# Patient Record
Sex: Male | Born: 1980 | Race: White | Hispanic: No | Marital: Married | Smoking: Current some day smoker
Health system: Southern US, Community
[De-identification: ages and names within clinical notes are randomized; demographics above are authoritative.]

## PROBLEM LIST (undated history)

## (undated) DIAGNOSIS — F419 Anxiety disorder, unspecified: Secondary | ICD-10-CM

## (undated) DIAGNOSIS — I10 Essential (primary) hypertension: Secondary | ICD-10-CM

## (undated) DIAGNOSIS — G479 Sleep disorder, unspecified: Secondary | ICD-10-CM

## (undated) DIAGNOSIS — R0683 Snoring: Secondary | ICD-10-CM

## (undated) DIAGNOSIS — E7800 Pure hypercholesterolemia, unspecified: Secondary | ICD-10-CM

## (undated) DIAGNOSIS — K760 Fatty (change of) liver, not elsewhere classified: Secondary | ICD-10-CM

## (undated) HISTORY — PX: OTHER SURGICAL HISTORY: SHX169

## (undated) HISTORY — PX: ANKLE FRACTURE SURGERY: SHX122

---

## 2017-01-31 NOTE — Telephone Encounter (Signed)
Pt called states he was in the ER and was referred to our office for his ER f/u. Pt requested for a call back to schedule.

## 2017-01-31 NOTE — Telephone Encounter (Signed)
Scheduled apt

## 2017-02-03 ENCOUNTER — Ambulatory Visit
Admit: 2017-02-03 | Discharge: 2017-02-03 | Payer: PRIVATE HEALTH INSURANCE | Attending: Family | Primary: Family Medicine

## 2017-02-03 DIAGNOSIS — I10 Essential (primary) hypertension: Secondary | ICD-10-CM

## 2017-02-03 MED ORDER — AMLODIPINE BESYLATE 10 MG PO TABS
10 MG | ORAL_TABLET | Freq: Every day | ORAL | 3 refills | Status: DC
Start: 2017-02-03 — End: 2017-10-19

## 2017-02-03 MED ORDER — OMEPRAZOLE 20 MG PO CPDR
20 MG | ORAL_CAPSULE | Freq: Every day | ORAL | 3 refills | Status: DC
Start: 2017-02-03 — End: 2017-08-08

## 2017-02-03 MED ORDER — ESCITALOPRAM OXALATE 10 MG PO TABS
10 | ORAL_TABLET | Freq: Every day | ORAL | 3 refills | Status: DC
Start: 2017-02-03 — End: 2017-08-08

## 2017-02-03 MED ORDER — VALSARTAN 160 MG PO TABS
160 | ORAL_TABLET | Freq: Two times a day (BID) | ORAL | 3 refills | Status: DC
Start: 2017-02-03 — End: 2017-09-12

## 2017-02-03 NOTE — Telephone Encounter (Signed)
CC triage note    Today's Date: 02/03/2017    CM Risk Score: @MHCMRISKSCORE @  Gagne Mortality Risk Score:      Social Determinates of Health assessment (SDHA)    Risk Drivers:     Medications: Not currently applicable     Disengagement: Not currently applicable    Disability: Not currently applicable     Social Risk Factors: Not currently applicable     Substance Abuse: Not currently applicable    Mental Health: anxiety    Functional Impairments: Not currently applicable      ACC: Leone HavenAshley Yeira Gulden, RN    Summary Note: triage note reviewed per chart. Will reach out to pt in 1-2 weeks to see if BP still elevated. Will attempt to meet with pt to discuss disease management and lifestyle changes if agreeable.      Disposition:   YNW:[id number]:Patient should NOT be placed in Complex Care Management  CC: Patient should be placed in Care Coordination    No future appointments.

## 2017-02-03 NOTE — Telephone Encounter (Signed)
Pt seen today for OV. Pt BP at initial assessment  165/112  and BP prior to d/c  150/90

## 2017-02-03 NOTE — Progress Notes (Signed)
[facility]  [address], [address]  [place], [place] [id number]    Subjective:    Chief Complaint   Patient presents with   . ED Follow-up     pneumonia [facility] ED 01/30/17. See Care Everywhere for records. Also Dx'd w/ diverticulitis       HPI  Here to establish care and ER follow up  Used to see PCP in [place]  [facility] and needs new PCP  Doesn't see any specialists    01/30/17- woke up dizzy, checked BP 204/120; has a root canal scheduled for next week, thought maybe it was anxiety relating to that; also ran out of his lexapro about a month ago for anxiety and thought it was all anxiety related but [facility]  In ER, did EKG, labs-- normal per pt, CXR and CT of chest/abd-- they said he had pna and diverticulitis; rx for moxifloxacin 400 qd for 7days, lexapro 10mg  (start 1/2 tab for first 4 days and then to 1 tab daily), xanax 0.25mg  TIDprn  (we cannot see records as pt did not bring them and he is new pt here and needs to sign release medical info)    Checks BPs at home, usually 160s-180s/100s on his normal dose of valsartan 160mg  twice daily  Pt states they gave him BP meds in the ER to bring it down but did not send him home with new rx  States he used to be on amlodipine 10mg  in the past but then he was able to get off of it bc of lifestyle changes  States he used to be on valsartan and HCTZ together tab but he was getting dehydrated with the frequent urination so his PCP just put him on plain valsartan  Works as a Data processing managermaintenance mechanic    Fam hx: mom (MIx3), dad (blood clot), paternal grandfather (CHF), maternal grandmother (HTN, stents placed)    Since his ER visit, states he feels better just has the pain from the tooth (upcoming root canal)  Only feels sob when he feels anxious  Has needed to use the xanax 3x since ER visit    Denies cp, palpitation, syncope, dizziness, lightheadedness, wheezing, numbness, tingling, weakness, visual changes, HAs     Review of  Systems   Constitutional: Negative for activity change, appetite change, chills, diaphoresis, fatigue, fever and unexpected weight change.   Eyes: Negative for visual disturbance.   Respiratory: Negative for cough, chest tightness, shortness of breath and wheezing.    Cardiovascular: Negative for chest pain, palpitations and leg swelling.   Gastrointestinal: Negative for diarrhea, nausea and vomiting.   Musculoskeletal: Negative for arthralgias, myalgias, neck pain and neck stiffness.   Skin: Negative for color change, pallor, rash and wound.   Neurological: Negative for dizziness, weakness, light-headedness, numbness and headaches.   Psychiatric/Behavioral: The patient is nervous/anxious.            Social History     Social History   . Marital status: Single     Spouse name: N/A   . Number of children: N/A   . Years of education: N/A     Occupational History   . Not on file.     Social History Main Topics   . Smoking status: Current Every Day Smoker     Packs/day: 0.25   . Smokeless tobacco: Never Used   . Alcohol use Not on file   . Drug use: Unknown   .  Sexual activity: Not on file     Other Topics Concern   . Not on file     Social History Narrative   . No narrative on file     Past Medical History:   Diagnosis Date   . Allergic rhinitis    . Anxiety    . Hypertension      History reviewed. No pertinent surgical history.  No Known Allergies  History reviewed. No pertinent family history.    Objective:       Vitals:    02/03/17 1047   BP: (!) 165/112   Pulse: 72   Temp: 97.8 F (36.6 C)   TempSrc: Oral   SpO2: 95%   Weight: (!) 336 lb 12.8 oz (152.8 kg)   Height: 5\' 11"  (1.803 m)           Current Outpatient Prescriptions   Medication Sig Dispense Refill   . acetaminophen-codeine (TYLENOL #3) 300-30 MG per tablet TAKE 1 TABLET BY MOUTH EVERY 6 HOURS AS NEEDED for up to 2 (TWO) days (no additional tylenol)  0   . ALPRAZolam (XANAX) 0.25 MG tablet TAKE 1 TABLET BY MOUTH THREE TIMES DAILY AS NEEDED FOR ANXIETY for  up to 2 (TWO) days  0   . escitalopram (LEXAPRO) 10 MG tablet Take 1/2 tablet (5 mg) by mouth once daily for 4 days, then take 1 tablet (10 mg) by mouth daily thereafter.  0   . moxifloxacin (AVELOX) 400 MG tablet TAKE 1 TABLET BY MOUTH ONCE DAILY FOR 7 DAYS  0   . valsartan (DIOVAN) 160 MG tablet 1 tablet 2 times daily     . cetirizine (ZYRTEC) 10 MG tablet Take 10 mg by mouth daily     . ibuprofen (ADVIL;MOTRIN) 200 MG tablet Take 200 mg by mouth every 6 hours as needed for Pain       No current facility-administered medications for this visit.        Physical Exam   Constitutional: He is oriented to person, place, and time. He appears well-developed and well-nourished. No distress.   HENT:   Head: Normocephalic.   Right Ear: External ear normal.   Left Ear: External ear normal.   Nose: Nose normal.   Mouth/Throat: Oropharynx is clear and moist. No oropharyngeal exudate.   Eyes: Conjunctivae and EOM are normal. Pupils are equal, round, and reactive to light. Right eye exhibits no discharge. Left eye exhibits no discharge. No scleral icterus.   Neck: Normal range of motion. Neck supple. No thyromegaly present.   Cardiovascular: Normal rate, regular rhythm and normal heart sounds.  Exam reveals no gallop and no friction rub.    No murmur heard.  Pulmonary/Chest: Effort normal and breath sounds normal. No respiratory distress. He has no wheezes. He has no rales.   Abdominal: Soft. Bowel sounds are normal. He exhibits no distension and no mass. There is no tenderness. There is no rebound and no guarding.   Lymphadenopathy:     He has no cervical adenopathy.   Neurological: He is alert and oriented to person, place, and time.   Skin: Skin is warm and dry. No rash noted. He is not diaphoretic. No erythema. No pallor.   Psychiatric: He has a normal mood and affect. His behavior is normal. Judgment and thought content normal.         Assessment:      Diagnosis Orders   1. Hypertension, unspecified type  valsartan (DIOVAN)  160 MG tablet  amLODIPine (NORVASC) 10 MG tablet   2. Anxiety  escitalopram (LEXAPRO) 10 MG tablet   3. Pneumonia due to infectious organism, unspecified laterality, unspecified part of lung  XR CHEST STANDARD (2 VW)   4. Gastroesophageal reflux disease, esophagitis presence not specified  omeprazole (PRILOSEC) 20 MG delayed release capsule         Plan:     Patient Instructions     Xray of chest/lungs ordered-- complete this in 3 weeks to ensure pneumonia has resolved  Will msg/call with results    Refilled:  Omeprazole 20mg - take 1 tab once daily  Lexapro 10mg - take 1 tab daily  Valsartan 160mg - take 1 tab twice daily as you have been taking it (max dose is 320mg  a day)    Start amlodipine 10mg - take 1 tab once daily    Continue with the xanax as needed from the ER  Consider counseling- local options for anxiety management:  Dr Aldean Jewett is available in our office [phone]  [place]: [phone] **24hr hotline  [place]:  [phone]  [place]: [phone]    Elevated BP:  Check BPs for 1 week (make sure your cuff at home is accurate by checking it against a store cuff)  Make sure you take the reading at least 1 hour after BP meds if on medication  Make sure to get BP readings at various times throughout the day  Input your results (if you have MyChart) or Call us with your results  One of our nurses will give you a call to discuss BP management in the next few weeks after reviewing your results    - MESSAGE THAT DAY (DONT WAIT A WEEK) if BP 180/100 or higher  - TO ER IF BP 180/100 or higher, or if you develop symptoms of headache, weakness, numbness/tingling, chest pain, short of breath, heart racing, leg swelling, vision changes    If symptoms worsen or change (persistent chest pain/shortness of breath/palpitations, visual changes, dizziness, lightheadedness, numbness, tingling, etc) -- TO ER.    Schedule your physical with new PCP here    Patient Education : high BP, dash diet  Reviewed plan  with Onalee Hua who agrees and verbalizes understanding with no further questions or concerns.    Bridgette Habermann CNP  [facility]  [address]   [place] [address]  P: [phone]  F: [phone]

## 2017-02-03 NOTE — Patient Instructions (Addendum)
Xray of chest/lungs ordered-- complete this in 3 weeks to ensure pneumonia has resolved  Will msg/call with results    Refilled:  Omeprazole 20mg - take 1 tab once daily  Lexapro 10mg - take 1 tab daily  Valsartan 160mg - take 1 tab twice daily as you have been taking it (max dose is 320mg  a day)    Start amlodipine 10mg - take 1 tab once daily    Continue with the xanax as needed from the ER  Consider counseling- local options for anxiety management:  Dr Aldean JewettHrabowy is available in our office [phone]  [facility]: [phone] **24hr hotline  [place]:  [phone]  [place]: [phone]    Elevated BP:  Check BPs for 1 week (make sure your cuff at home is accurate by checking it against a store cuff)  Make sure you take the reading at least 1 hour after BP meds if on medication  Make sure to get BP readings at various times throughout the day  Input your results (if you have MyChart) or Call us with your results  One of our nurses will give you a call to discuss BP management in the next few weeks after reviewing your results    - MESSAGE THAT DAY (DONT WAIT A WEEK) if BP 180/100 or higher  - TO ER IF BP 180/100 or higher, or if you develop symptoms of headache, weakness, numbness/tingling, chest pain, short of breath, heart racing, leg swelling, vision changes    If symptoms worsen or change (persistent chest pain/shortness of breath/palpitations, visual changes, dizziness, lightheadedness, numbness, tingling, etc) -- TO ER.    Schedule your physical with new PCP here    Patient Education        Acute High Blood Pressure: Care Instructions  Your Care Instructions    Acute high blood pressure is very high blood pressure. It's a serious problem. Very high blood pressure can damage your brain, heart, eyes, and kidneys.  You may have been given medicines to lower your blood pressure. You may have gotten them as pills or through a needle in one of your veins. This is called an IV. And maybe you were given other  medicines too. These can be needed when high blood pressure causes other problems.  To keep your blood pressure at a lower level, you may need to make healthy lifestyle changes. And you will probably need to take medicines.  Be sure to follow up with your doctor about your blood pressure and what you can do about it.  Follow-up care is a key part of your treatment and safety. Be sure to make and go to all appointments, and call your doctor if you are having problems. It's also a good idea to know your test results and keep a list of the medicines you take.  How can you care for yourself at home?   See your doctor as often as he or she recommends. This is to make sure your blood pressure is under control. You will probably go at least 2 times a year. But it may be more often at first.   Take your blood pressure medicine exactly as prescribed. You may take one or more types. They include diuretics, beta-blockers, ACE inhibitors, calcium channel blockers, and angiotensin II receptor blockers. Call your doctor if you think you are having a problem with your medicine.   If you take blood pressure medicine, talk to your doctor before you take decongestants or anti-inflammatory medicine, such as ibuprofen. These  can raise blood pressure.   Learn how to check your blood pressure at home. Check it often.   Ask your doctor if it's okay to drink alcohol.   Talk to your doctor about lifestyle changes that can help blood pressure. These include being active and not smoking.  When should you call for help?  Call 911 anytime you think you may need emergency care. This may mean having symptoms that suggest that your blood pressure is causing a serious heart or blood vessel problem. Your blood pressure may be over 180/110.  For example, call 911 if:    You have symptoms of a heart attack. These may include:   Chest pain or pressure, or a strange feeling in the chest.   Sweating.   Shortness of breath.   Nausea or  vomiting.   Pain, pressure, or a strange feeling in the back, neck, jaw, or upper belly or in one or both shoulders or arms.   Lightheadedness or sudden weakness.   A fast or irregular heartbeat.     You have symptoms of a stroke. These may include:   Sudden numbness, tingling, weakness, or loss of movement in your face, arm, or leg, especially on only one side of your body.   Sudden vision changes.   Sudden trouble speaking.   Sudden confusion or trouble understanding simple statements.   Sudden problems with walking or balance.   A sudden, severe headache that is different from past headaches.     You have severe back or belly pain.   Do not wait until your blood pressure comes down on its own. Get help right away.  Call your doctor now or seek immediate care if:    Your blood pressure is much higher than normal (such as 180/110 or higher), but you don't have symptoms.     You think high blood pressure is causing symptoms, such as:   Severe headache.   Blurry vision.   Watch closely for changes in your health, and be sure to contact your doctor if:    Your blood pressure measures 140/90 or higher at least 2 times. That means the top number is 140 or higher or the bottom number is 90 or higher, or both.     You think you may be having side effects from your blood pressure medicine.     Your blood pressure is usually normal, but it goes above normal at least 2 times.   Where can you learn more?  Go to [email or web address] and sign in to your MyChart account. Enter [phone] in the Search Health Information box to learn more about "Acute High Blood Pressure: Care Instructions."     If you do not have an account, please click on the "Sign Up Now" link.  Current as of: Jan 13, 2016  Content Version: 11.6   2006-2018 [employer]. Care instructions adapted under license by [facility]. If you have questions about a medical condition or this instruction, always ask  your healthcare professional. Healthwise, Incorporated disclaims any warranty or liability for your use of this information.       Patient Education        DASH Diet: Care Instructions  Your Care Instructions    The DASH diet is an eating plan that can help lower your blood pressure. DASH stands for Dietary Approaches to Stop Hypertension. Hypertension is high blood pressure.  The DASH diet focuses on eating foods that are high in calcium, potassium, and magnesium.  These nutrients can lower blood pressure. The foods that are highest in these nutrients are fruits, vegetables, low-fat dairy products, nuts, seeds, and legumes. But taking calcium, potassium, and magnesium supplements instead of eating foods that are high in those nutrients does not have the same effect. The DASH diet also includes whole grains, fish, and poultry.  The DASH diet is one of several lifestyle changes your doctor may recommend to lower your high blood pressure. Your doctor may also want you to decrease the amount of sodium in your diet. Lowering sodium while following the DASH diet can lower blood pressure even further than just the DASH diet alone.  Follow-up care is a key part of your treatment and safety. Be sure to make and go to all appointments, and call your doctor if you are having problems. It's also a good idea to know your test results and keep a list of the medicines you take.  How can you care for yourself at home?  Following the DASH diet   Eat 4 to 5 servings of fruit each day. A serving is 1 medium-sized piece of fruit,  cup chopped or canned fruit, 1/4 cup dried fruit, or 4 ounces ( cup) of fruit juice. Choose fruit more often than fruit juice.   Eat 4 to 5 servings of vegetables each day. A serving is 1 cup of lettuce or raw leafy vegetables,  cup of chopped or cooked vegetables, or 4 ounces ( cup) of vegetable juice. Choose vegetables more often than vegetable juice.   Get 2 to 3 servings of low-fat and fat-free  dairy each day. A serving is 8 ounces of milk, 1 cup of yogurt, or 1  ounces of cheese.   Eat 6 to 8 servings of grains each day. A serving is 1 slice of bread, 1 ounce of dry cereal, or  cup of cooked rice, pasta, or cooked cereal. Try to choose whole-grain products as much as possible.   Limit lean meat, poultry, and fish to 2 servings each day. A serving is 3 ounces, about the size of a deck of cards.   Eat 4 to 5 servings of nuts, seeds, and legumes (cooked dried beans, lentils, and split peas) each week. A serving is 1/3 cup of nuts, 2 tablespoons of seeds, or  cup of cooked beans or peas.   Limit fats and oils to 2 to 3 servings each day. A serving is 1 teaspoon of vegetable oil or 2 tablespoons of salad dressing.   Limit sweets and added sugars to 5 servings or less a week. A serving is 1 tablespoon jelly or jam,  cup sorbet, or 1 cup of lemonade.   Eat less than 2,300 milligrams (mg) of sodium a day. If you limit your sodium to 1,500 mg a day, you can lower your blood pressure even more.  Tips for success   Start small. Do not try to make dramatic changes to your diet all at once. You might feel that you are missing out on your favorite foods and then be more likely to not follow the plan. Make small changes, and stick with them. Once those changes become habit, add a few more changes.   Try some of the following:   Make it a goal to eat a fruit or vegetable at every meal and at snacks. This will make it easy to get the recommended amount of fruits and vegetables each day.   Try yogurt topped with fruit and nuts for  a snack or healthy dessert.   Add lettuce, tomato, cucumber, and onion to sandwiches.   Combine a ready-made pizza crust with low-fat mozzarella cheese and lots of vegetable toppings. Try using tomatoes, squash, spinach, broccoli, carrots, cauliflower, and onions.   Have a variety of cut-up vegetables with a low-fat dip as an appetizer instead of chips and dip.   Sprinkle  sunflower seeds or chopped almonds over salads. Or try adding chopped walnuts or almonds to cooked vegetables.   Try some vegetarian meals using beans and peas. Add garbanzo or kidney beans to salads. Make burritos and tacos with mashed pinto beans or black beans.  Where can you learn more?  Go to [email or web address] and sign in to your MyChart account. Enter [phone] in the Search Health Information box to learn more about "DASH Diet: Care Instructions."     If you do not have an account, please click on the "Sign Up Now" link.  Current as of: August 10, 2016  Content Version: 11.6   2006-2018 [employer]wise, [employer]. Care instructions adapted under license by [facility]. If you have questions about a medical condition or this instruction, always ask your healthcare professional. Healthwise, Incorporated disclaims any warranty or liability for your use of this information.

## 2017-03-03 ENCOUNTER — Encounter: Attending: Family | Primary: Family Medicine

## 2017-03-06 NOTE — Telephone Encounter (Signed)
Pt had apt that was missed apt / no show. 1st missed apt

## 2017-03-23 NOTE — Telephone Encounter (Signed)
Pt would like to know what he should do in regards to valsarten recall.    Please advise. Am I advising pt regarding other pharmacies?

## 2017-03-23 NOTE — Telephone Encounter (Signed)
The FDA put out a great summary of the recall and what to do next at the following website:   http://hill.biz/Https://www.fda.gov/NewsEvents/Newsroom/PressAnnouncements/ucm613532.htm     Not all Valsartan products are being recalled. Check with your pharmacy to see who manufactures your medication; the following products are on the recall list at this time:   Recalled Products   Medicine Company   Valsartan      [employer]   Valsartan      [employer]   Valsartan      [employer]   Valsartan/Hydrochlorothiazide (HCTZ)  [employer]   Valsartan/Hydrochlorothiazide (HCTZ)  [employer]     If your medication IS included in the recall, you should follow the recall instructions provided by the specific company (this is on the FDA website: TriviaBus.dehttps://www.fda.gov/Drugs/DrugSafety/DrugRecalls/default.htm)     In the meantime, the FDA does NOT recommend that you stop taking your medication until you have a replacement product.       Let me know if you have questions!

## 2017-03-23 NOTE — Telephone Encounter (Signed)
Spoke w/ pt & advised.  Sts he did contact pharmacy & his med is among the recalled.  Requests replacement product to [facility].  Pt has 3 days supply left

## 2017-03-24 MED ORDER — LOSARTAN POTASSIUM 100 MG PO TABS
100 MG | ORAL_TABLET | Freq: Every day | ORAL | 3 refills | Status: DC
Start: 2017-03-24 — End: 2017-09-12

## 2017-03-24 NOTE — Telephone Encounter (Signed)
erx done for losartan 1 a day  Check bps daily if you have a bp cuff at home or 2-3x a wk at a pharmacy to make sure that the losartan is keeping the bps <140/<90

## 2017-03-24 NOTE — Telephone Encounter (Signed)
LMOM per [facility] advising pt of [place] instructions. Nothing further needed at this time.

## 2017-08-08 ENCOUNTER — Ambulatory Visit
Admit: 2017-08-08 | Discharge: 2017-08-08 | Payer: PRIVATE HEALTH INSURANCE | Attending: Family | Primary: Family Medicine

## 2017-08-08 DIAGNOSIS — F419 Anxiety disorder, unspecified: Secondary | ICD-10-CM

## 2017-08-08 MED ORDER — ESCITALOPRAM OXALATE 20 MG PO TABS
20 | ORAL_TABLET | Freq: Every day | ORAL | 3 refills | Status: DC
Start: 2017-08-08 — End: 2017-10-19

## 2017-08-08 MED ORDER — BUSPIRONE HCL 5 MG PO TABS
5 MG | ORAL_TABLET | Freq: Three times a day (TID) | ORAL | 0 refills | Status: DC
Start: 2017-08-08 — End: 2017-09-12

## 2017-08-08 MED ORDER — OMEPRAZOLE 20 MG PO CPDR
20 | ORAL_CAPSULE | Freq: Every day | ORAL | 0 refills | Status: DC
Start: 2017-08-08 — End: 2017-09-12

## 2017-08-08 MED ORDER — LOSARTAN POTASSIUM-HCTZ 100-12.5 MG PO TABS
ORAL_TABLET | Freq: Every day | ORAL | 5 refills | Status: DC
Start: 2017-08-08 — End: 2017-09-12

## 2017-08-08 NOTE — Progress Notes (Signed)
[facility]  [address], [address]  [place], [place] [id number]    Subjective:    Chief Complaint   Patient presents with   . Other     [facility] ER f/u 07/26/17 rib injury, dx with muscle strain.    . Medication Refill     Omeprazole, escitalopram s/e panick attacks, Losartan- vertigo concerns. 160/90   . Other     Health Savings account/note for gym membership.    . Other     164/115 elevated bp recheck       HPI  Here today for ER follow up, refills, and discuss anxiety    ER follow up:  States he was laying on the floor and his son jumped off the couch onto him while they were playing  His right chest hurt afterwards  The next day 07/26/17, his wife was hugging him and he felt like the rib popped bc it hurt more  07/26/17 [facility] ER for right rib pain; CXR wnl; dx with muscle strain- rx for naproxen, told to take deep breaths, ice and f/u in 5-7 days  Since then pt states everything has completely resolved  No current complaints    Anxiety/panic attacks:  States that his anxiety has been getting worse  Has been on lexapro 10mg  for the last 4 yrs  Initially on this by former PCP DR Brimmer in [place] for anxiety  Normally feels well managed on it, but when he gets stressed he occasionally will have panic attacks  States he had a really bad panic attack this summer- went to [facility] and they did a full cardiac workup and told him it was anxiety  Recently has been more stressed  Up for a promotion at work  Also has financial strain at home  Problems with his car as well  Notices he has less motivation and sleeps more  Optician, 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  Feels good with them  Unsure the exact name of the company  Denies SI/attempts/plans    Incidentally BPs are high this morning:  Checks BPs once in the morning and once at night-- usually 160s/90s, HRs in 80s  Takes amlodipine 10mg  nightly  Losartan 100mg  daily (was on valsartan but switched with the recall)  Dr Natividad BroodBrimmer tried him  on beta blockers in the past but his HRs would drop too low and pt had sx of fatigue; was on valsartan and HCTZ combo but didn't like that he was sweating so much with the water pill-- states he would like to try this combo again bc 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   Eyes: Negative for visual disturbance.   Respiratory: Negative for cough, chest tightness, shortness of breath and wheezing.    Cardiovascular: Negative for chest pain, palpitations and leg swelling.   Gastrointestinal: Negative for nausea and vomiting.   Musculoskeletal:        See hpi   Skin: Negative for color change, pallor, rash and wound.   Neurological: Negative for dizziness, weakness, light-headedness, numbness  and headaches.   Psychiatric/Behavioral: Positive for sleep disturbance. Negative for agitation, behavioral problems, confusion, decreased concentration, dysphoric mood and suicidal ideas. The patient is nervous/anxious.         See hpi           Social History     Social History   . Marital status: Married     Spouse name: N/A   . Number of children: N/A   . Years of education: N/A     Occupational History   . Not on file.     Social History Main Topics   . Smoking status: Current Every Day Smoker     Packs/day: 0.25   . Smokeless tobacco: Never Used   . Alcohol use Not on file   . Drug use: Unknown   . Sexual activity: Not on file     Other Topics Concern   . Not on file     Social History Narrative   . No narrative on file     Past Medical History:   Diagnosis Date   . Allergic rhinitis    . Anxiety    . Hypertension      History  reviewed. No pertinent surgical history.  No Known Allergies  History reviewed. No pertinent family history.    Objective:       Vitals:    08/08/17 0912   BP: (!) 164/115   Pulse: 84   Temp: 97.6 F (36.4 C)   SpO2: 97%   Weight: (!) 329 lb 12.8 oz (149.6 kg)           Current Outpatient Prescriptions   Medication Sig Dispense Refill   . losartan (COZAAR) 100 MG tablet Take 1 tablet by mouth daily 30 tablet 3   . cetirizine (ZYRTEC) 10 MG tablet Take 10 mg by mouth daily     . ibuprofen (ADVIL;MOTRIN) 200 MG tablet Take 200 mg by mouth every 6 hours as needed for Pain     . escitalopram (LEXAPRO) 10 MG tablet Take 1 tablet by mouth daily 30 tablet 3   . omeprazole (PRILOSEC) 20 MG delayed release capsule Take 1 capsule by mouth daily 30 capsule 3   . amLODIPine (NORVASC) 10 MG tablet Take 1 tablet by mouth daily 30 tablet 3   . acetaminophen-codeine (TYLENOL #3) 300-30 MG per tablet TAKE 1 TABLET BY MOUTH EVERY 6 HOURS AS NEEDED for up to 2 (TWO) days (no additional tylenol)  0   . ALPRAZolam (XANAX) 0.25 MG tablet TAKE 1 TABLET BY MOUTH THREE TIMES DAILY AS NEEDED FOR ANXIETY for up to 2 (TWO) days  0   . moxifloxacin (AVELOX) 400 MG tablet TAKE 1 TABLET BY MOUTH ONCE DAILY FOR 7 DAYS  0   . valsartan (DIOVAN) 160 MG tablet Take 1 tablet by mouth 2 times daily 30 tablet 3     No current facility-administered medications for this visit.        Physical Exam   Constitutional: He is oriented to person, place, and time. He appears well-developed and well-nourished. No distress.   HENT:   Head: Normocephalic.   Right Ear: External ear normal.   Left Ear: External ear normal.   Nose: Nose normal.   Mouth/Throat: Oropharynx is clear and moist. No oropharyngeal exudate.   Eyes: Pupils are equal, round, and reactive to light. Conjunctivae and EOM are normal. Right eye exhibits no discharge. Left eye exhibits no discharge. No scleral icterus.   Neck: Normal  range of motion. Neck supple. No thyromegaly present.    Cardiovascular: Normal rate, regular rhythm, normal heart sounds and intact distal pulses.  Exam reveals no gallop and no friction rub.    No murmur heard.  Pulmonary/Chest: Effort normal and breath sounds normal. No respiratory distress. He has no wheezes. He has no rales. He exhibits no tenderness.   Musculoskeletal: Normal range of motion.   Lymphadenopathy:     He has no cervical adenopathy.   Neurological: He is alert and oriented to person, place, and time.   Skin: Skin is warm. Capillary refill takes less than 2 seconds. No rash noted. He is not diaphoretic. No erythema. No pallor.   Psychiatric: He has a normal mood and affect. His behavior is normal. Judgment and thought content normal.         Assessment:      Diagnosis Orders   1. Anxiety  escitalopram (LEXAPRO) 20 MG tablet   2. Panic attacks     3. Stress     4. Hypertension, unspecified type         Plan:     Patient Instructions     Glad to hear your muscle strain has resolved    For panic attacks and increased stress:  STOP lexapro 10mg   Start lexapro 20mg  (new rx sent)- 1 tab once daily  Start buspar 5mg - take 1 tab up to 3x a day as needed for panic attacks/anxiety  Continue seeing your counselor weekly    For high blood pressure:  STOP losartan  Start combo pill losartan-HCTZ (100-12.5)/hyzaar- take 1 tab once daily in the morning  Continue amlodipine/norvasc 10mg   Check BPs/HRs for 1 week (make sure your cuff at home is accurate by checking it against a store cuff)  Make sure you take the reading at least 1 hour after BP meds if on medication  Make sure to get BP readings at various times throughout the day  Input your results (if you have MyChart) or Call us with your results    - MESSAGE THAT DAY (DONT WAIT A WEEK) if BP 180/100 or higher  - TO ER IF BP 180/100 or higher, or if you develop symptoms of headache, weakness, numbness/tingling, chest pain, short of breath, heart racing, leg swelling, vision changes    Return in 1 month for follow  up of BPs and of anxiety/panic attacks  Schedule physical with your PCP here  Patient Education : anxiety, stress, panic attacks, htn, dash diet  Reviewed plan with Onalee Huaavid who agrees and verbalizes understanding with no further questions or concerns.    Bridgette HabermannKristina Cristianna Cyr CNP  [facility]  [place]  [address] [address]   [place] [id number]  P: [phone]  F: [phone]

## 2017-08-08 NOTE — Patient Instructions (Addendum)
Glad to hear your muscle strain has resolved    For panic attacks and increased stress:  STOP lexapro 10mg   Start lexapro 20mg  (new rx sent)- 1 tab once daily  Start buspar 5mg - take 1 tab up to 3x a day as needed for panic attacks/anxiety  Continue seeing your counselor weekly    For high blood pressure:  STOP losartan  Start combo pill losartan-HCTZ (100-12.5)/hyzaar- take 1 tab once daily in the morning  Continue amlodipine/norvasc 10mg   Check BPs/HRs for 1 week (make sure your cuff at home is accurate by checking it against a store cuff)  Make sure you take the reading at least 1 hour after BP meds if on medication  Make sure to get BP readings at various times throughout the day  Input your results (if you have MyChart) or Call us with your results    - MESSAGE THAT DAY (DONT WAIT A WEEK) if BP 180/100 or higher  - TO ER IF BP 180/100 or higher, or if you develop symptoms of headache, weakness, numbness/tingling, chest pain, short of breath, heart racing, leg swelling, vision changes    Return in 1 month for follow up of BPs and of anxiety/panic attacks  Schedule physical with your PCP here  Patient Education        Panic Attacks: Care Instructions  Your Care Instructions    During a panic attack, you may have a feeling of intense fear or terror, trouble breathing, chest pain or tightness, heartbeat changes, dizziness, sweating, and shaking. A panic attack starts suddenly and usually lasts from 5 to 20 minutes but may last even longer. You have the most anxiety about 10 minutes after the attack starts. An attack can begin with a stressful event, or it can happen without a cause.  Although panic attacks can cause scary symptoms, you can learn to manage them with self-care, counseling, and medicine.  Follow-up care is a key part of your treatment and safety. Be sure to make and go to all appointments, and call your doctor if you are having problems. It's also a good idea to know your test results and keep a list  of the medicines you take.  How can you care for yourself at home?   Take your medicine exactly as directed. Call your doctor if you think you are having a problem with your medicine.   Go to your counseling sessions and follow-up appointments.   Recognize and accept your anxiety. Then, when you are in a situation that makes you anxious, say to yourself, "This is not an emergency. I feel uncomfortable, but I am not in danger. I can keep going even if I feel anxious."   Be kind to your body:  ? Relieve tension with exercise or a massage.  ? Get enough rest.  ? Avoid alcohol, caffeine, nicotine, and illegal drugs. They can increase your anxiety level, cause sleep problems, or trigger a panic attack.  ? Learn and do relaxation techniques. See below for more about these techniques.   Engage your mind. Get out and do something you enjoy. Go to a funny movie, or take a walk or hike. Plan your day. Having too much or too little to do can make you anxious.   Keep a record of your symptoms. Discuss your fears with a good friend or family member, or join a support group for people with similar problems. Talking to others sometimes relieves stress.   Get involved in social groups, or  volunteer to help others. Being alone sometimes makes things seem worse than they are.   Get at least 30 minutes of exercise on most days of the week to relieve stress. Walking is a good choice. You also may want to do other activities, such as running, swimming, cycling, or playing tennis or team sports.  Relaxation techniques  Do relaxation exercises for 10 to 20 minutes a day. You can play soothing, relaxing music while you do them, if you wish.   Tell others in your house that you are going to do your relaxation exercises. Ask them not to disturb you.   Find a comfortable place, away from all distractions and noise.   Lie down on your back, or sit with your back straight.   Focus on your breathing. Make it slow and  steady.   Breathe in through your nose. Breathe out through either your nose or mouth.   Breathe deeply, filling up the area between your navel and your rib cage. Breathe so that your belly goes up and down.   Do not hold your breath.   Breathe like this for 5 to 10 minutes. Notice the feeling of calmness throughout your whole body.  As you continue to breathe slowly and deeply, relax by doing the following for another 5 to 10 minutes:   Tighten and relax each muscle group in your body. You can begin at your toes and work your way up to your head.   Imagine your muscle groups relaxing and becoming heavy.   Empty your mind of all thoughts.   Let yourself relax more and more deeply.   Become aware of the state of calmness that surrounds you.   When your relaxation time is over, you can bring yourself back to alertness by moving your fingers and toes and then your hands and feet and then stretching and moving your entire body. Sometimes people fall asleep during relaxation, but they usually wake up shortly afterward.   Always give yourself time to return to full alertness before you drive a car or do anything that might cause an accident if you are not fully alert. Never play a relaxation tape while driving a car.  When should you call for help?  Call 911 anytime you think you may need emergency care. For example, call if:    You feel you cannot stop from hurting yourself or someone else.   Watch closely for changes in your health, and be sure to contact your doctor if:    Your panic attacks get worse.     You have new or different anxiety.     You are not getting better as expected.   Where can you learn more?  Go to [email or web address] and sign in to your MyChart account. Enter H601 in the Search Health Information box to learn more about "Panic Attacks: Care Instructions."     If you do not have an account, please click on the "Sign Up Now" link.  Current as of: August 11, 2016  Content Version: 11.8   2006-2018 [employer]. Care instructions adapted under license by [facility]. If you have questions about a medical condition or this instruction, always ask your healthcare professional. [employer] disclaims any warranty or liability for your use of this information.         Patient Education        Work-Life Balance: Care Instructions  Your Care Instructions    Do you ever feel like  there is not enough time to do all of the things you have to do, and no time at all for the things you enjoy? If so, you are not alone.  On average, people in the [place] have worked more and more hours each year since 1970. But in recent years, fewer people say they want to take on more at work, even if they would get promoted or get paid more money. More and more workers say they want time to spend with their families and to do things that are important to them.  Do you ever feel:   That you always have more and more work to do at your job?   That too many people depend on you every day?   That you never have enough time for your family or friends?   That you never have time for hobbies or things you enjoy?   That each second of your day is scheduled?  If you answered "yes" to any of these questions, take steps at work and at home to get your life into balance.  Follow-up care is a key part of your treatment and safety. Be sure to make and go to all appointments, and call your doctor if you are having problems.  How can you care for yourself at home?  Manage your time   Focus on the important things. Taking on too much can wear you out. Look at how you spend your time, and redirect your focus. Learn to say "no" and let go of things that do not matter.   Set one small goal at a time. Use a day planner. Break large projects into smaller ones.   Ask for help. Let your children, your spouse, your coworkers, and other people in your life help you get things  done.   Leave your job at the office. If you give up free time to get more work done, you may pay for it with stress. If your job offers a flexible work schedule, use it to fit your own work style. For instance, come in earlier to have a longer lunch break, or make time for a yoga class or workout during your workday.   Unplug. Do not let technology (such as your cell phone or the Internet) erase the line between your time and your employer's time.  Lower job stress  Job stress causes trouble at work and at home. At work, you may worry about things you have not had time to do at home. At home, you may worry about your job. This cycle upsets your work-life balance. Lowering your job stress can get your life back in balance.  Job stress can be caused by:   Pressure and deadlines.   Heavy workloads or long hours.   Not being allowed to make decisions.   Health and safety hazards.   Feeling you may lose your job.   Unclear or changing job duties.   Too much responsibility.   Work that is very tiring or boring.  Do any of these things bother you? Consider talking with your boss to change things. There are some things that you may not be able to control. But even a few small changes might help lower your stress.  Take advantage of programs at work  Businesses make money and are better off in other ways if their employees are healthy and happy. For this reason, many companies have programs to help balance work life and home life.  These  programs may include:   Flexible schedules and hours.   Time off for family reasons, education, or community service.   Being able to work from home.   Employee assistance programs to provide counseling.   Child-care programs.  Check to see if your company has any of these or other programs that could help you. If not, consider talking to your boss about why work-life balance programs make good business sense. Even if your company does not start an official program, you may  be able to get flexible hours, time off, or the ability to do some work from home.  Know when to quit  If you are truly unhappy because of a stressful job, and if the suggestions here have not worked, it may be time to think about changing jobs or Hotel managerchanging careers. But before you quit, take time to research your options.  Where can you learn more?  Go to [email or web address] and sign in to your MyChart account. Enter [id number] in the Search Health Information box to learn more about "Work-Life Balance: Care Instructions."     If you do not have an account, please click on the "Sign Up Now" link.  Current as of: June 14, 2016  Content Version: 11.8   2006-2018 [employer], [employer]. Care instructions adapted under license by [facility]. If you have questions about a medical condition or this instruction, always ask your healthcare professional. [employer], [employer] disclaims any warranty or liability for your use of this information.         Patient Education        DASH Diet: Care Instructions  Your Care Instructions    The DASH diet is an eating plan that can help lower your blood pressure. DASH stands for Dietary Approaches to Stop Hypertension. Hypertension is high blood pressure.  The DASH diet focuses on eating foods that are high in calcium, potassium, and magnesium. These nutrients can lower blood pressure. The foods that are highest in these nutrients are fruits, vegetables, low-fat dairy products, nuts, seeds, and legumes. But taking calcium, potassium, and magnesium supplements instead of eating foods that are high in those nutrients does not have the same effect. The DASH diet also includes whole grains, fish, and poultry.  The DASH diet is one of several lifestyle changes your doctor may recommend to lower your high blood pressure. Your doctor may also want you to decrease the amount of sodium in your diet. Lowering sodium while following the DASH diet can lower blood pressure  even further than just the DASH diet alone.  Follow-up care is a key part of your treatment and safety. Be sure to make and go to all appointments, and call your doctor if you are having problems. It's also a good idea to know your test results and keep a list of the medicines you take.  How can you care for yourself at home?  Following the DASH diet   Eat 4 to 5 servings of fruit each day. A serving is 1 medium-sized piece of fruit,  cup chopped or canned fruit, 1/4 cup dried fruit, or 4 ounces ( cup) of fruit juice. Choose fruit more often than fruit juice.   Eat 4 to 5 servings of vegetables each day. A serving is 1 cup of lettuce or raw leafy vegetables,  cup of chopped or cooked vegetables, or 4 ounces ( cup) of vegetable juice. Choose vegetables more often than vegetable juice.   Get 2 to 3 servings of low-fat and fat-free  dairy each day. A serving is 8 ounces of milk, 1 cup of yogurt, or 1  ounces of cheese.   Eat 6 to 8 servings of grains each day. A serving is 1 slice of bread, 1 ounce of dry cereal, or  cup of cooked rice, pasta, or cooked cereal. Try to choose whole-grain products as much as possible.   Limit lean meat, poultry, and fish to 2 servings each day. A serving is 3 ounces, about the size of a deck of cards.   Eat 4 to 5 servings of nuts, seeds, and legumes (cooked dried beans, lentils, and split peas) each week. A serving is 1/3 cup of nuts, 2 tablespoons of seeds, or  cup of cooked beans or peas.   Limit fats and oils to 2 to 3 servings each day. A serving is 1 teaspoon of vegetable oil or 2 tablespoons of salad dressing.   Limit sweets and added sugars to 5 servings or less a week. A serving is 1 tablespoon jelly or jam,  cup sorbet, or 1 cup of lemonade.   Eat less than 2,300 milligrams (mg) of sodium a day. If you limit your sodium to 1,500 mg a day, you can lower your blood pressure even more.  Tips for success   Start small. Do not try to make dramatic changes to your  diet all at once. You might feel that you are missing out on your favorite foods and then be more likely to not follow the plan. Make small changes, and stick with them. Once those changes become habit, add a few more changes.   Try some of the following:  ? Make it a goal to eat a fruit or vegetable at every meal and at snacks. This will make it easy to get the recommended amount of fruits and vegetables each day.  ? Try yogurt topped with fruit and nuts for a snack or healthy dessert.  ? Add lettuce, tomato, cucumber, and onion to sandwiches.  ? Combine a ready-made pizza crust with low-fat mozzarella cheese and lots of vegetable toppings. Try using tomatoes, squash, spinach, broccoli, carrots, cauliflower, and onions.  ? Have a variety of cut-up vegetables with a low-fat dip as an appetizer instead of chips and dip.  ? Sprinkle sunflower seeds or chopped almonds over salads. Or try adding chopped walnuts or almonds to cooked vegetables.  ? Try some vegetarian meals using beans and peas. Add garbanzo or kidney beans to salads. Make burritos and tacos with mashed pinto beans or black beans.  Where can you learn more?  Go to [email or web address] and sign in to your MyChart account. Enter [phone] in the Search Health Information box to learn more about "DASH Diet: Care Instructions."     If you do not have an account, please click on the "Sign Up Now" link.  Current as of: August 10, 2016  Content Version: 11.8   2006-2018 [employer], [employer]. Care instructions adapted under license by [facility]. If you have questions about a medical condition or this instruction, always ask your healthcare professional. [employer], [employer] disclaims any warranty or liability for your use of this information.         Patient Education        Learning About High Blood Pressure  What is high blood pressure?    Blood pressure is a measure of how hard the blood pushes against the walls of your arteries.  It's normal for blood pressure to go up and down  throughout the day, but if it stays up, you have high blood pressure. Another name for high blood pressure is hypertension.  Two numbers tell you your blood pressure. The first number is the systolic pressure. It shows how hard the blood pushes when your heart is pumping. The second number is the diastolic pressure. It shows how hard the blood pushes between heartbeats, when your heart is relaxed and filling with blood.  A blood pressure of less than 120/80 (say "120 over 80") is ideal for an adult. High blood pressure is 130/80 or higher. You have high blood pressure if your top number is 130 or higher or your bottom number is 80 or higher, or both.  What happens when you have high blood pressure?   Blood flows through your arteries with too much force. Over time, this damages the walls of your arteries. But you can't feel it. High blood pressure usually doesn't cause symptoms.   Fat and calcium start to build up in your arteries. This buildup is called plaque. Plaque makes your arteries narrower and stiffer. Blood can't flow through them as easily.   This lack of good blood flow starts to damage some of the organs in your body. This can lead to problems such as coronary artery disease and heart attack, heart failure, stroke, kidney failure, and eye damage.  How can you prevent high blood pressure?   Stay at a healthy weight.   Try to limit how much sodium you eat to less than 2,300 milligrams (mg) a day. If you limit your sodium to 1,500 mg a day, you can lower your blood pressure even more.  ? Buy foods that are labeled "unsalted," "sodium-free," or "low-sodium." Foods labeled "reduced-sodium" and "light sodium" may still have too much sodium.  ? Flavor your food with garlic, lemon juice, onion, vinegar, herbs, and spices instead of salt. Do not use soy sauce, steak sauce, onion salt, garlic salt, mustard, or ketchup on your food.  ? Use less salt (or none)  when recipes call for it. You can often use half the salt a recipe calls for without losing flavor.   Be physically active. Get at least 30 minutes of exercise on most days of the week. Walking is a good choice. You also may want to do other activities, such as running, swimming, cycling, or playing tennis or team sports.   Limit alcohol to 2 drinks a day for men and 1 drink a day for women.   Eat plenty of fruits, vegetables, and low-fat dairy products. Eat less saturated and total fats.  How is high blood pressure treated?   Your doctor will suggest making lifestyle changes. For example, your doctor may ask you to eat healthy foods, quit smoking, lose extra weight, and be more active.   If lifestyle changes don't help enough, your doctor may recommend that you take medicine.   When blood pressure is very high, medicines are needed to lower it.  Follow-up care is a key part of your treatment and safety. Be sure to make and go to all appointments, and call your doctor if you are having problems. It's also a good idea to know your test results and keep a list of the medicines you take.  Where can you learn more?  Go to [email or web address] and sign in to your MyChart account. Enter P501 in the Search Health Information box to learn more about "Learning About High Blood Pressure."     If you do not  have an account, please click on the "Sign Up Now" link.  Current as of: August 10, 2016  Content Version: 11.8   2006-2018 [employer], [employer]. Care instructions adapted under license by [facility]. If you have questions about a medical condition or this instruction, always ask your healthcare professional. [place] any warranty or liability for your use of this information.

## 2017-09-12 ENCOUNTER — Ambulatory Visit
Admit: 2017-09-12 | Discharge: 2017-09-12 | Payer: PRIVATE HEALTH INSURANCE | Attending: Family | Primary: Family Medicine

## 2017-09-12 DIAGNOSIS — I10 Essential (primary) hypertension: Secondary | ICD-10-CM

## 2017-09-12 MED ORDER — LOSARTAN POTASSIUM-HCTZ 100-25 MG PO TABS
100-25 MG | ORAL_TABLET | Freq: Every day | ORAL | 2 refills | Status: DC
Start: 2017-09-12 — End: 2017-10-19

## 2017-09-12 MED ORDER — BUSPIRONE HCL 5 MG PO TABS
5 | ORAL_TABLET | Freq: Three times a day (TID) | ORAL | 2 refills | Status: AC
Start: 2017-09-12 — End: 2017-10-12

## 2017-09-12 MED ORDER — OMEPRAZOLE 20 MG PO CPDR
20 | ORAL_CAPSULE | Freq: Every day | ORAL | 2 refills | Status: DC
Start: 2017-09-12 — End: 2017-10-19

## 2017-09-12 NOTE — Patient Instructions (Addendum)
Glad to hear your anxiety and panic attacks are well controlled  Continue with the lexapro 20mg  once daily and the buspar 5mg  as needed up to 3x a day  Continue seeing your counselor    Stop the combo pill (losartan-HCTZ 100-12.5) and we ordered the high dose of the combo pill  Start losartan- HCTZ 100-25--- take 1 tab once daily  Continue amlodipine/norvasc 10mg  1 tab once daily    Check BPs/HRs for 1 week (make sure your cuff at home is accurate by checking it against a store cuff)  Make sure you take the reading at least 1 hour after BP meds if on medication  Make sure to get BP readings at various times throughout the day  Input your results (if you have MyChart) or Call us with your results in 1 week    - MESSAGE THAT DAY (DONT WAIT A WEEK) if BP 180/100 or higher  - TO ER IF BP 180/100 or higher, or if you develop symptoms of headache, weakness, numbness/tingling, chest pain, short of breath, heart racing, leg swelling, vision changes      Referral placed to [facility]-- someone will contact you to set this up      Patient Education        DASH Diet: Care Instructions  Your Care Instructions    The DASH diet is an eating plan that can help lower your blood pressure. DASH stands for Dietary Approaches to Stop Hypertension. Hypertension is high blood pressure.  The DASH diet focuses on eating foods that are high in calcium, potassium, and magnesium. These nutrients can lower blood pressure. The foods that are highest in these nutrients are fruits, vegetables, low-fat dairy products, nuts, seeds, and legumes. But taking calcium, potassium, and magnesium supplements instead of eating foods that are high in those nutrients does not have the same effect. The DASH diet also includes whole grains, fish, and poultry.  The DASH diet is one of several lifestyle changes your doctor may recommend to lower your high blood pressure. Your doctor may also want you to decrease the amount of sodium in your diet.  Lowering sodium while following the DASH diet can lower blood pressure even further than just the DASH diet alone.  Follow-up care is a key part of your treatment and safety. Be sure to make and go to all appointments, and call your doctor if you are having problems. It's also a good idea to know your test results and keep a list of the medicines you take.  How can you care for yourself at home?  Following the DASH diet   Eat 4 to 5 servings of fruit each day. A serving is 1 medium-sized piece of fruit,  cup chopped or canned fruit, 1/4 cup dried fruit, or 4 ounces ( cup) of fruit juice. Choose fruit more often than fruit juice.   Eat 4 to 5 servings of vegetables each day. A serving is 1 cup of lettuce or raw leafy vegetables,  cup of chopped or cooked vegetables, or 4 ounces ( cup) of vegetable juice. Choose vegetables more often than vegetable juice.   Get 2 to 3 servings of low-fat and fat-free dairy each day. A serving is 8 ounces of milk, 1 cup of yogurt, or 1  ounces of cheese.   Eat 6 to 8 servings of grains each day. A serving is 1 slice of bread, 1 ounce of dry cereal, or  cup of cooked rice, pasta, or cooked  cereal. Try to choose whole-grain products as much as possible.   Limit lean meat, poultry, and fish to 2 servings each day. A serving is 3 ounces, about the size of a deck of cards.   Eat 4 to 5 servings of nuts, seeds, and legumes (cooked dried beans, lentils, and split peas) each week. A serving is 1/3 cup of nuts, 2 tablespoons of seeds, or  cup of cooked beans or peas.   Limit fats and oils to 2 to 3 servings each day. A serving is 1 teaspoon of vegetable oil or 2 tablespoons of salad dressing.   Limit sweets and added sugars to 5 servings or less a week. A serving is 1 tablespoon jelly or jam,  cup sorbet, or 1 cup of lemonade.   Eat less than 2,300 milligrams (mg) of sodium a day. If you limit your sodium to 1,500 mg a day, you can lower your blood pressure even more.  Tips for  success   Start small. Do not try to make dramatic changes to your diet all at once. You might feel that you are missing out on your favorite foods and then be more likely to not follow the plan. Make small changes, and stick with them. Once those changes become habit, add a few more changes.   Try some of the following:  ? Make it a goal to eat a fruit or vegetable at every meal and at snacks. This will make it easy to get the recommended amount of fruits and vegetables each day.  ? Try yogurt topped with fruit and nuts for a snack or healthy dessert.  ? Add lettuce, tomato, cucumber, and onion to sandwiches.  ? Combine a ready-made pizza crust with low-fat mozzarella cheese and lots of vegetable toppings. Try using tomatoes, squash, spinach, broccoli, carrots, cauliflower, and onions.  ? Have a variety of cut-up vegetables with a low-fat dip as an appetizer instead of chips and dip.  ? Sprinkle sunflower seeds or chopped almonds over salads. Or try adding chopped walnuts or almonds to cooked vegetables.  ? Try some vegetarian meals using beans and peas. Add garbanzo or kidney beans to salads. Make burritos and tacos with mashed pinto beans or black beans.  Where can you learn more?  Go to [email or web address] and sign in to your MyChart account. Enter [phone] in the Search Health Information box to learn more about "DASH Diet: Care Instructions."     If you do not have an account, please click on the "Sign Up Now" link.  Current as of: August 10, 2016  Content Version: 11.8   2006-2018 [employer]. Care instructions adapted under license by [facility]. If you have questions about a medical condition or this instruction, always ask your healthcare professional. [employer], Incorporated disclaims any warranty or liability for your use of this information.

## 2017-09-12 NOTE — Progress Notes (Signed)
[facility]  [address]  [place], [place] [address]    Subjective:    Chief Complaint   Patient presents with   . Other     1 month f/u-omeprazole refills. Lap Band questions.   . Medication Refill     omeprazole refill   . Other     160/80 average bps at home- no sx at home. Pt states 170/90 pt exp floaters x 08/08/17.   . Other     178/101 elevated bp- recheck.        HPI  Here today for 1 month follow up  08/08/17 was here for anxiety and panic attacks, incidental finding of high BP  Was told at that time to stop the 10mg  and new erx for 20mg  lexapro was started; added buspar 5mg  TID prn and continue to see counselor weekly  For bp that visit, was told to stop losartan and Start combo pill losartan-HCTZ (100-12.5)/hyzaar- take 1 tab once daily in the morning, Continue amlodipine/norvasc 10mg , and monitor BPs and given parameters for ER    Since that visit, feels well managed with lexapro and buspar dose  States hes only needed to take the buspar a couple times  Doesn't want to make changes to these    Taking BPs and HRs at home  BPs ranging 150s-160s/80s; HRs 50s-60s  States he hasnt had any floaters since being on the combo pill  No sx high blood pressure: cp, palpitations, sweating, sob, dizziness, HA, visual changes, lightheadedness, numbness, tingling    Would also like referral for lap band/weight management order     Review of Systems   Constitutional: Negative for activity change, appetite change, chills, diaphoresis, fatigue, fever and unexpected weight change.   Eyes: Negative for visual disturbance.   Respiratory: Negative for apnea, cough, choking, chest tightness, shortness of breath, wheezing and stridor.    Cardiovascular: Negative for chest pain, palpitations and leg swelling.   Gastrointestinal: Negative for nausea.   Skin: Negative for color change, pallor, rash and wound.   Neurological: Negative for dizziness, tremors, seizures, syncope, facial asymmetry,  speech difficulty, weakness, light-headedness, numbness and headaches.   Psychiatric/Behavioral: Negative for decreased concentration, dysphoric mood, sleep disturbance and suicidal ideas. The patient is not nervous/anxious.            Social History     Social History   . Marital status: Married     Spouse name: N/A   . Number of children: N/A   . Years of education: N/A     Occupational History   . Not on file.     Social History Main Topics   . Smoking status: Current Every Day Smoker     Packs/day: 0.25   . Smokeless tobacco: Never Used   . Alcohol use Not on file   . Drug use: Unknown   . Sexual activity: Not on file     Other Topics Concern   . Not on file     Social History Narrative   . No narrative on file     Past Medical History:   Diagnosis Date   . Allergic rhinitis    . Anxiety    . Hypertension      History reviewed. No pertinent surgical history.  No Known Allergies  History reviewed. No pertinent family history.    Objective:       Vitals:    09/12/17 0920   BP: (!) 178/102   Pulse:  74   Temp: 98.4 F (36.9 C)   SpO2: 95%   Weight: (!) 322 lb 3.2 oz (146.1 kg)           Current Outpatient Prescriptions   Medication Sig Dispense Refill   . busPIRone (BUSPAR) 5 MG tablet Take 5 mg by mouth 3 times daily     . omeprazole (PRILOSEC) 20 MG delayed release capsule Take 1 capsule by mouth daily 30 capsule 0   . escitalopram (LEXAPRO) 20 MG tablet Take 1 tablet by mouth daily 30 tablet 3   . losartan-hydrochlorothiazide (HYZAAR) 100-12.5 MG per tablet Take 1 tablet by mouth daily 30 tablet 5   . cetirizine (ZYRTEC) 10 MG tablet Take 10 mg by mouth daily     . amLODIPine (NORVASC) 10 MG tablet Take 1 tablet by mouth daily 30 tablet 3   . losartan (COZAAR) 100 MG tablet Take 1 tablet by mouth daily 30 tablet 3   . ibuprofen (ADVIL;MOTRIN) 200 MG tablet Take 200 mg by mouth every 6 hours as needed for Pain       No current facility-administered medications for this visit.        Physical Exam    Constitutional: He is oriented to person, place, and time. He appears well-developed and well-nourished. No distress.   HENT:   Head: Normocephalic.   Right Ear: External ear normal.   Left Ear: External ear normal.   Nose: Nose normal.   Mouth/Throat: Oropharynx is clear and moist. No oropharyngeal exudate.   Eyes: Pupils are equal, round, and reactive to light. Conjunctivae are normal. Right eye exhibits no discharge. Left eye exhibits no discharge. No scleral icterus.   Cardiovascular: Normal rate, regular rhythm, normal heart sounds and intact distal pulses.  Exam reveals no gallop and no friction rub.    No murmur heard.  Pulmonary/Chest: Effort normal and breath sounds normal. No respiratory distress. He has no wheezes. He has no rales. He exhibits no tenderness.   Neurological: He is alert and oriented to person, place, and time.   Skin: Skin is warm and dry. Capillary refill takes less than 2 seconds. No rash noted. He is not diaphoretic. No erythema. No pallor.   Psychiatric: He has a normal mood and affect. His behavior is normal. Judgment and thought content normal.         Assessment:      Diagnosis Orders   1. Hypertension, unspecified type     2. Anxiety     3. BMI 45.0-49.9, adult (HCC)  [facility]         Plan:     Patient Instructions   Glad to hear your anxiety and panic attacks are well controlled  Continue with the lexapro 20mg  once daily and the buspar 5mg  as needed up to 3x a day  Continue seeing your counselor    Stop the combo pill (losartan-HCTZ 100-12.5) and we ordered the high dose of the combo pill  Start losartan- HCTZ 100-25--- take 1 tab once daily  Continue amlodipine/norvasc 10mg  1 tab once daily    Check BPs/HRs for 1 week (make sure your cuff at home is accurate by checking it against a store cuff)  Make sure you take the reading at least 1 hour after BP meds if on medication  Make sure to get BP readings at various  times throughout the day  Input your results (if you have MyChart) or  Call us with your results in 1 week    - MESSAGE THAT DAY (DONT WAIT A WEEK) if BP 180/100 or higher  - TO ER IF BP 180/100 or higher, or if you develop symptoms of headache, weakness, numbness/tingling, chest pain, short of breath, heart racing, leg swelling, vision changes      Referral placed to [facility]-- someone will contact you to set this up      Patient Education : dash diet  Reviewed plan with Onalee Hua who agrees and verbalizes understanding with no further questions or concerns.    Bridgette Habermann CNP  [facility]  [address]   [place] [address]  P: [phone]  F: [phone]

## 2017-10-19 ENCOUNTER — Ambulatory Visit
Admit: 2017-10-19 | Discharge: 2017-10-19 | Payer: PRIVATE HEALTH INSURANCE | Attending: Family Medicine | Primary: Family Medicine

## 2017-10-19 DIAGNOSIS — Z0000 Encounter for general adult medical examination without abnormal findings: Secondary | ICD-10-CM

## 2017-10-19 LAB — COMPREHENSIVE METABOLIC PANEL
ALT: 213 U/L — ABNORMAL HIGH (ref 13–69)
AST: 148 U/L — ABNORMAL HIGH (ref 15–46)
Albumin,Serum: 5.3 g/dL — ABNORMAL HIGH (ref 3.5–5.0)
Alkaline Phosphatase: 100 U/L (ref 38–126)
Anion Gap: 13 NA
BUN: 16 mg/dL (ref 7–20)
CO2: 30 mmol/L (ref 22–30)
Calcium: 10.8 mg/dL — ABNORMAL HIGH (ref 8.4–10.4)
Chloride: 97 mmol/L — ABNORMAL LOW (ref 98–107)
Creatinine: 1 mg/dL (ref 0.52–1.25)
Glucose: 92 mg/dL (ref 70–100)
Potassium: 4.1 mmol/L (ref 3.5–5.1)
Sodium: 140 mmol/L (ref 135–145)
Total Bilirubin: 0.9 mg/dL (ref 0.2–1.3)
Total Protein: 8.4 g/dL — ABNORMAL HIGH (ref 6.3–8.2)
eGFR AA: >60 mL/min (ref >60)
eGFR NON-AA: >60 mL/min (ref >60)

## 2017-10-19 LAB — CBC WITH AUTO DIFFERENTIAL
Basophils %: 0.7 % (ref 0.0–2.0)
Basophils Absolute: 0.1 10*3/uL (ref 0.0–0.2)
Eosinophils Absolute: 0.2 10*3/uL (ref 0.0–0.5)
Eosinophils: 1.4 % (ref 1.0–6.0)
Granulocytes %: 69.1 % (ref 40.0–80.0)
Hematocrit: 50.5 % (ref 40.0–52.0)
Hemoglobin: 16.8 g/dL (ref 13.0–18.0)
Lymphocyte %: 19.3 % — ABNORMAL LOW (ref 20.0–40.0)
Lymphocytes Absolute: 2.2 10*3/uL (ref 1.0–4.3)
MCH: 31.5 pg (ref 26.0–34.0)
MCHC: 33.3 % (ref 32.0–36.0)
MCV: 94.7 fL (ref 80.0–98.0)
MPV: 8.6 fL (ref 7.4–10.4)
Monocytes %: 9.5 % (ref 2.0–10.0)
Monocytes Absolute: 1.1 10*3/uL — ABNORMAL HIGH (ref 0.0–0.8)
Neutrophils Absolute: 7.7 10*3/uL — ABNORMAL HIGH (ref 1.8–7.0)
Platelets: 299 10*3/uL (ref 140–440)
RBC: 5.33 10*6/uL (ref 4.40–5.90)
RDW: 15.2 % — ABNORMAL HIGH (ref 11.5–14.5)
WBC: 11.2 10*3/uL — ABNORMAL HIGH (ref 3.6–10.7)

## 2017-10-19 LAB — LIPID PANEL
Chol/HDL Ratio: 5 NA
Cholesterol: 271 mg/dL — AB (ref <200)
HDL: 56 mg/dL (ref 40–60)
LDL Cholesterol: 186 mg/dL — AB (ref <100)
Triglycerides: 146 mg/dL (ref <150)

## 2017-10-19 MED ORDER — OMEPRAZOLE 20 MG PO CPDR
20 | ORAL_CAPSULE | Freq: Every day | ORAL | 4 refills | Status: DC
Start: 2017-10-19 — End: 2018-11-12

## 2017-10-19 MED ORDER — LOSARTAN POTASSIUM-HCTZ 100-25 MG PO TABS
100-25 | ORAL_TABLET | Freq: Every day | ORAL | 4 refills | Status: DC
Start: 2017-10-19 — End: 2018-11-12

## 2017-10-19 MED ORDER — METOPROLOL TARTRATE 25 MG PO TABS
25 MG | ORAL_TABLET | Freq: Two times a day (BID) | ORAL | 12 refills | Status: DC
Start: 2017-10-19 — End: 2018-11-12

## 2017-10-19 MED ORDER — ESCITALOPRAM OXALATE 20 MG PO TABS
20 | ORAL_TABLET | Freq: Every day | ORAL | 4 refills | Status: DC
Start: 2017-10-19 — End: 2018-11-12

## 2017-10-19 MED ORDER — AMLODIPINE BESYLATE 10 MG PO TABS
10 | ORAL_TABLET | Freq: Every day | ORAL | 4 refills | Status: DC
Start: 2017-10-19 — End: 2018-11-12

## 2017-10-19 NOTE — Telephone Encounter (Signed)
Pt cd requesting referral to sleep Dr.  Marita [place]LMOM per hipaa  Advising of Ivanka's message below

## 2017-10-19 NOTE — Progress Notes (Signed)
Subjective:    Chief Complaint   Patient presents with   . Annual Exam   . Immunizations     Flu, pneu 23, Tdap   . Hypertension     Has been checking BP at home, has recent readings 201/149, 163/93, 190/120, 140/102   . Cough     x 3 weeks, denies fever, nasal drng, HA, body aches.  Taking OTC Robutussin, Dayquil       When the BP is high, he sees floaters and may get a little dizzy.  Taking both medications regularly.  Watches the salt, is trying to quit smoking.  Is down to half a pack daily.  Does the elliptical twice weekly.  Wife says that he stops breathing when he sleeps.  He works third shift.  Never feels rested when he wakes up.  OSA could contribute to the elevated BP.  Chart reviewed.           Review of Systems   Constitutional: Positive for activity change and fatigue.   HENT: Negative.    Respiratory: Positive for apnea. Negative for shortness of breath and wheezing.    Cardiovascular: Negative for chest pain and leg swelling.   Gastrointestinal: Negative for blood in stool, constipation and diarrhea.   Genitourinary: Negative for difficulty urinating, flank pain and hematuria.   Musculoskeletal: Negative for back pain.           Social History     Social History   . Marital status: Married     Spouse name: N/A   . Number of children: N/A   . Years of education: N/A     Occupational History   . Not on file.     Social History Main Topics   . Smoking status: Current Every Day Smoker     Packs/day: 0.25     Types: Cigarettes   . Smokeless tobacco: Never Used      Comment: pt slowly cutting down on usage   . Alcohol use Yes      Comment: social/holidays   . Drug use: No   . Sexual activity: Not on file     Other Topics Concern   . Not on file     Social History Narrative   . No narrative on file     Past Medical History:   Diagnosis Date   . Allergic rhinitis    . Anxiety    . Hypertension      History reviewed. No pertinent surgical history.  Allergies   Allergen Reactions   . Lactose Intolerance (Gi)       Family History   Problem Relation Age of Onset   . No Known Problems Mother    . No Known Problems Father        Objective:       Vitals:    10/19/17 0724 10/19/17 0738   BP: (!) 157/101 (!) 158/102   Pulse: 99    Temp: 97.9 F (36.6 C)    SpO2: 94%    Weight: (!) 327 lb (148.3 kg)    Height: 5\' 11"  (1.803 m)            Current Outpatient Prescriptions   Medication Sig Dispense Refill   . amLODIPine (NORVASC) 10 MG tablet Take 1 tablet by mouth daily 90 tablet 4   . escitalopram (LEXAPRO) 20 MG tablet Take 1 tablet by mouth daily 90 tablet 4   . losartan-hydrochlorothiazide (HYZAAR) 100-25 MG per tablet Take 1  tablet by mouth daily 90 tablet 4   . omeprazole (PRILOSEC) 20 MG delayed release capsule Take 1 capsule by mouth daily 90 capsule 4   . metoprolol (LOPRESSOR) 25 MG tablet Take 1 tablet by mouth 2 times daily 60 tablet 12   . cetirizine (ZYRTEC) 10 MG tablet Take 10 mg by mouth daily     . ibuprofen (ADVIL;MOTRIN) 200 MG tablet Take 200 mg by mouth every 6 hours as needed for Pain       No current facility-administered medications for this visit.        Physical Exam   Constitutional: He is oriented to person, place, and time. He appears well-developed. No distress.   BMI is 45   HENT:   Head: Normocephalic and atraumatic.   Right Ear: External ear normal.   Left Ear: External ear normal.   Nose: Nose normal.   Eyes: Pupils are equal, round, and reactive to light. Conjunctivae and EOM are normal. No scleral icterus.   Neck: Normal range of motion. Neck supple. No JVD present. No thyromegaly present.   Cardiovascular: Normal rate, regular rhythm, normal heart sounds and intact distal pulses.    No murmur heard.  Pulmonary/Chest: Effort normal and breath sounds normal. He has no wheezes.   Abdominal: Soft. Bowel sounds are normal. He exhibits no distension and no mass. There is no tenderness.   Musculoskeletal: He exhibits no edema.   Lymphadenopathy:     He has no cervical adenopathy.   Neurological: He  is alert and oriented to person, place, and time. He has normal reflexes. No cranial nerve deficit.   Skin: Skin is warm. No rash noted. He is not diaphoretic.   Psychiatric: He has a normal mood and affect. His behavior is normal.   Nursing note and vitals reviewed.        Assessment:      Diagnosis Orders   1. Routine adult health maintenance  CBC Auto Differential    Comprehensive Metabolic Panel    Lipid Panel    CBC Auto Differential    Comprehensive Metabolic Panel    Lipid Panel   2. Need for prophylactic vaccination against Streptococcus pneumoniae (pneumococcus)  Pneumococcal polysaccharide vaccine 23-valent greater than or equal to 2yo subcutaneous/IM   3. Hypertension, unspecified type  amLODIPine (NORVASC) 10 MG tablet    losartan-hydrochlorothiazide (HYZAAR) 100-25 MG per tablet    metoprolol (LOPRESSOR) 25 MG tablet   4. Anxiety  escitalopram (LEXAPRO) 20 MG tablet   5. Positive depression screening  Positive Screen for Clinical Depression with a Documented Follow-up Plan [id number]   6. Need for tetanus booster  Tdap (age 8y and older) IM (Boostrix)   7. Need for influenza vaccination  INFLUENZA, QUADV, 6 MO AND OLDER, IM, PF, PREFILL SYR, 0.5ML (FLUARIX QUADV, PF)   8. Sleep disturbance  Baseline Diagnostic Sleep Study   9. Sleep apnea, unspecified type  Baseline Diagnostic Sleep Study         Plan:     Goals     . Patient Stated (pt-stated)            Decrease BP    Barriers: diet  Plan for overcoming my barriers: improve diet, cooking fresh foods, lose weight  Confidence: 4/10  Anticipated Goal Completion Date: 04/2018              Loanne Drilling, MD          On the basis of positive PHQ-9 screening (  PHQ-9 Total Score: 0), the following plan was implemented: patient declines further evaluation/treatment for depression and He feels that it is under control at this time.  Patient will follow-up in 3 month(s) with PCP.

## 2017-10-19 NOTE — Patient Instructions (Signed)
Patient Education        Learning About Benefits From Quitting Smoking  How does quitting smoking make you healthier?    If you're thinking about quitting smoking, you may have a few reasons to be smoke-free. Your health may be one of them.   When you quit smoking, you lower your risks for cancer, lung disease, heart attack, stroke, blood vessel disease, and blindness from macular degeneration.   When you're smoke-free, you get sick less often, and you heal faster. You are less likely to get colds, flu, bronchitis, and pneumonia.   As a nonsmoker, you may find that your mood is better and you are less stressed.  When and how will you feel healthier?  Quitting has real health benefits that start from day 1 of being smoke-free. And the longer you stay smoke-free, the healthier you get and the better you feel.  The first hours   After just 20 minutes, your blood pressure and heart rate go down. That means there's less stress on your heart and blood vessels.   Within 12 hours, the level of carbon monoxide in your blood drops back to normal. That makes room for more oxygen. With more oxygen in your body, you may notice that you have more energy than when you smoked.  After 2 weeks   Your lungs start to work better.   Your risk of heart attack starts to drop.  After 1 month   When your lungs are clear, you cough less and breathe deeper, so it's easier to be active.   Your sense of taste and smell return. That means you can enjoy food more than you have since you started smoking.  Over the years   After 1 year, your risk of heart disease is half what it would be if you kept smoking.   After 5 years, your risk of stroke starts to shrink. Within a few years after that, it's about the same as if you'd never smoked.   After 10 years, your risk of dying from lung cancer is cut by about half. And your risk for many other types of cancer is lower too.  How would quitting help others in your life?  When you quit  smoking, you improve the health of everyone who now breathes in your smoke.   Their heart, lung, and cancer risks drop, much like yours.   They are sick less. For babies and small children, living smoke-free means they're less likely to have ear infections, pneumonia, and bronchitis.   If you're a woman who is or will be pregnant someday, quitting smoking means a healthier newborn.   Children who are close to you are less likely to become adult smokers.  Where can you learn more?  Go to [email or web address] and sign in to your MyChart account. Enter O319 in the Search Health Information box to learn more about "Learning About Benefits From Quitting Smoking."     If you do not have an account, please click on the "Sign Up Now" link.  Current as of: May 31, 2017  Content Version: 11.9   2006-2018 [employer], [employer]. Care instructions adapted under license by [facility]. If you have questions about a medical condition or this instruction, always ask your healthcare professional. [employer], [employer] disclaims any warranty or liability for your use of this information.

## 2017-10-26 ENCOUNTER — Telehealth

## 2017-10-26 NOTE — Telephone Encounter (Signed)
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  Baseline diagnostic sleep study ordered previously 10/19/17

## 2017-10-27 NOTE — Telephone Encounter (Signed)
Pt notified by phone.

## 2017-10-27 NOTE — Telephone Encounter (Signed)
Home sleep study ordered-- someone will contact pt regarding this

## 2017-10-31 NOTE — Telephone Encounter (Signed)
Name of Caller: Murriel Hopper phone number: verified     Relationship to Patient: patient    Chief Complaint/Reason for Call: Pt stated he has not been notified as of yet for his sleep study and is requesting a call back      Best time of day caller can be reached: anytime        Patient advised that office/PCP has 24-48 business hours to return their call: Yes

## 2017-11-01 NOTE — Telephone Encounter (Signed)
lmom re: ivanka's note below and phone number [phone] left for pt to call and confirm

## 2017-11-01 NOTE — Telephone Encounter (Signed)
It is attached in Media this Pt is scheduled 11/08/2017 for his test. Pt does not remember when he scheduled his appointment? For more instruction he needs to call [phone]

## 2017-11-01 NOTE — Telephone Encounter (Signed)
Ivanka/would you know who did this pt's sleep study? Please review and advise/we need to get the results.

## 2017-11-02 NOTE — Telephone Encounter (Signed)
Name of Caller: Keith Gallegos    Contact phone number: [phone] (home)     Relationship to Patient: patient    Chief Complaint/Reason for Call: PATIENT IS WONDERING IF THE AT HOME SLEEP STUDY HAS BEEN APPROVED YET. THANK YOU.     Best time of day caller can be reached: AM, ANYTIME BEFORE 10 A.M.       Patient advised that office/PCP has 24-48 business hours to return their call: Yes

## 2017-11-02 NOTE — Telephone Encounter (Signed)
Pt requests advised if PA has been approved for sleep study.  LMOM per hipaa advising of Ivanka's comments.

## 2017-11-02 NOTE — Telephone Encounter (Signed)
Called Pt and LM for him. Pt is scheduled for Sleep Study w/titration on 11/08/2017 I see another order for Home Sleep Study just from 02/22 Not sure what does he want to do. Home Sleep Study or in Lab? Both Sleep Study no PA required.   Please let Pt know that he can schedule also for Home Sleep but I would prefer in Lab because is better for him  to set up for CPAP.

## 2017-11-07 NOTE — Telephone Encounter (Signed)
Pt lm re cb for sleep study at home lab order.    LMOM advising pt of [place] prior notes in regards to PA.    Requested cb from pt to clarify what is needed.     First attempt- awaiting cb from pt at this time.

## 2017-11-10 NOTE — Telephone Encounter (Signed)
Pt is calling to inquire about his at home sleep study. Waiting to hear information about this.

## 2017-11-13 NOTE — Telephone Encounter (Signed)
Pt do not need PA . Per dr. Jean Rosenthal was ordered Baseline Sleep Study and Pt needs to schedule in the lab to call number [phone]

## 2017-11-15 NOTE — Telephone Encounter (Signed)
Message left for pt to call the office.

## 2017-11-22 NOTE — Telephone Encounter (Signed)
Left a message for pt to call if he still needs help with this sleep study.

## 2017-12-01 NOTE — Other (Unsigned)
Patient Acct Nbr: [id number]   Primary AUTH/CERT:   Primary Insurance Company Name: [employer]  Primary Insurance Plan name: [facility]  Primary Insurance Group Number: [id number]  Primary Insurance Plan Type: Health  Primary Insurance Policy Number: [id number]

## 2017-12-06 ENCOUNTER — Encounter

## 2017-12-07 NOTE — Telephone Encounter (Signed)
It showed snoring , but no sleep apnea.  No need for CPAP.  He may want to see an ENT to discuss the snoring.  Or he could see a Sleep medicine doctor to evaluate further.  Weight loss would be helpful

## 2017-12-07 NOTE — Telephone Encounter (Signed)
Pt is requesting sleep study results. I see the results are now scanned in Epic.

## 2017-12-07 NOTE — Telephone Encounter (Signed)
Number listed on demographics non-working.  Contacted home number listed on [employer] & LMOM per [employer] advising of Dr. Edison PaceJackson's recommendations

## 2018-01-19 ENCOUNTER — Encounter: Attending: Family Medicine | Primary: Family Medicine

## 2018-11-12 ENCOUNTER — Ambulatory Visit: Admit: 2018-11-12 | Discharge: 2018-11-12 | Payer: BLUE CROSS/BLUE SHIELD | Attending: Family | Primary: Family Medicine

## 2018-11-12 ENCOUNTER — Encounter

## 2018-11-12 DIAGNOSIS — Z0000 Encounter for general adult medical examination without abnormal findings: Secondary | ICD-10-CM

## 2018-11-12 MED ORDER — LOSARTAN POTASSIUM-HCTZ 100-25 MG PO TABS
100-25 MG | ORAL_TABLET | Freq: Every day | ORAL | 4 refills | Status: DC
Start: 2018-11-12 — End: 2019-05-09

## 2018-11-12 MED ORDER — BUPROPION HCL ER (XL) 150 MG PO TB24
150 MG | ORAL_TABLET | Freq: Every morning | ORAL | 3 refills | Status: DC
Start: 2018-11-12 — End: 2019-10-10

## 2018-11-12 MED ORDER — ESCITALOPRAM OXALATE 20 MG PO TABS
20 MG | ORAL_TABLET | Freq: Every day | ORAL | 4 refills | Status: DC
Start: 2018-11-12 — End: 2019-10-10

## 2018-11-12 MED ORDER — AMLODIPINE BESYLATE 10 MG PO TABS
10 MG | ORAL_TABLET | Freq: Every day | ORAL | 0 refills | Status: DC
Start: 2018-11-12 — End: 2019-04-22

## 2018-11-12 MED ORDER — OMEPRAZOLE 20 MG PO CPDR
20 MG | ORAL_CAPSULE | Freq: Every day | ORAL | 4 refills | Status: DC
Start: 2018-11-12 — End: 2020-01-23

## 2018-11-12 MED ORDER — METOPROLOL TARTRATE 50 MG PO TABS
50 MG | ORAL_TABLET | Freq: Two times a day (BID) | ORAL | 4 refills | Status: DC
Start: 2018-11-12 — End: 2019-05-09

## 2018-11-12 NOTE — Telephone Encounter (Signed)
Last Cpe 10/19/17

## 2018-11-12 NOTE — Patient Instructions (Addendum)
Annual physical/wellness visit:  Labs ordered today- complete when fasting  Will msg/call with results    Vaccines today- none due    Screening(s) ordered:  Colon cancer screening- start age 37   Breast cancer screening-   Cervical cancer screening- n/a  Prostate cancer screening- start age 37  Osteoporosis/bone density screening- n/a  AAA screening- start age 37 for males and high risk females   Lung cancer screening- start age 37 for smokers    *It is always recommended for any orders (labs, imaging, procedures, etc) to check with your insurance provider for expected costs/expenses to you    Stress, depression:  Continue taking your lexapro 20mg  1 tab once daily  Start taking wellbutrin 150mg  1 tab once daily  Discussed s/e and timeframe of effectiveness    Continue seeing your support groups and work wellness counselor    Return in 4 weeks for follow up    Elevated blood pressure:  Continue taking your:  Amlodipine 10mg  1 tab once daily   Losartan-HCTZ 100-25mg  1 tab once daily    Stop taking your Metoprolol 25mg   Start taking metoprolol 50mg  1 tab twice daily    Check BPs/HRs for 1 week (make sure your cuff at home is accurate by checking it against a store cuff)  Make sure you take the reading at least 1 hour after BP meds if on medication  Make sure to get BP readings at various times throughout the day  IN ONE WEEK---Input your results on mychart or Call us with your results   ??  - MESSAGE THAT DAY (DONT WAIT A WEEK) if BP 180/100 or higher  - TO ER IF BP 180/100 or higher, or if you develop symptoms of headache, weakness, numbness/tingling, chest pain, short of breath, heart racing, leg swelling, vision changes  ??    Chronic diarrhea and recent right upper abdominal pain:  Labs ordered (above)  US ordered of the abdomen- call [phone] to schedule    Referral placed to GI- you call to schedule:  [facility]  [address] [place], [place], [place] [id number]  [phone]  [phone]    ---------------------  Below are general recommendations for your well visit:    Exercise:  - Recommend cardio 4-5 days per week; 30 minutes each day    Diet:  - Breakfast with fiber (4 grams) and whole grains (check ingredient list; "whole wheat" should be first listed ingredient), protein (eggs, peanut butter OK)  - Lunch with protein and 1 cup vegetables  - Dinner with protein and 1 cup vegetables  - 2 servings of fruit per day  - 2 servings of dairy per day (milk, cheese, yogurt, cottage cheese)  - Snacks with protein, fiber, healthy fats (examples: hard boiled eggs, 1/4 cup nuts, hummus, vegetables, fruit, yogurt (greek has high protein content), triscuits or popcorn (stay within serving size)  - Drink water throughout day (8 glasses per day)  - Limit alcohol intake to 1 drink per day for women and 2 drinks per day for men (1 drink=12oz beer or 5oz wine or 1 1/2oz liquor)    Healthy Lifestyle:  - Use sunscreen  - Wear seatbelt  - See eye doctor every 1-2 years  - See dentist every 6-12 months  - Physical every 1 year    Immunizations:  - Tetanus every 10 years (every 5 years if dirty cut)  - Flu vaccine recommended every flu season     Patient Education  Well Visit, Ages 38 to 48: Care Instructions  Your Care Instructions    Physical exams can help you stay healthy. Your doctor has checked your overall health and may have suggested ways to take good care of yourself. He or she also may have recommended tests. At home, you can help prevent illness with healthy eating, regular exercise, and other steps.  Follow-up care is a key part of your treatment and safety. Be sure to make and go to all appointments, and call your doctor if you are having problems. It's also a good idea to know your test results and keep a list of the medicines you take.  How can you care for yourself at home?  ?? Reach and stay at a healthy weight. This will lower your risk for many problems, such as obesity, diabetes,  heart disease, and high blood pressure.  ?? Get at least 30 minutes of physical activity on most days of the week. Walking is a good choice. You also may want to do other activities, such as running, swimming, cycling, or playing tennis or team sports. Discuss any changes in your exercise program with your doctor.  ?? Do not smoke or allow others to smoke around you. If you need help quitting, talk to your doctor about stop-smoking programs and medicines. These can increase your chances of quitting for good.  ?? Talk to your doctor about whether you have any risk factors for sexually transmitted infections (STIs). Having one sex partner (who does not have STIs and does not have sex with anyone else) is a good way to avoid these infections.  ?? Use birth control if you do not want to have children at this time. Talk with your doctor about the choices available and what might be best for you.  ?? Protect your skin from too much sun. When you're outdoors from 10 a.m. to 4 p.m., stay in the shade or cover up with clothing and a hat with a wide brim. Wear sunglasses that block UV rays. Even when it's cloudy, put broad-spectrum sunscreen (SPF 30 or higher) on any exposed skin.  ?? See a dentist one or two times a year for checkups and to have your teeth cleaned.  ?? Wear a seat belt in the car.  Follow your doctor's advice about when to have certain tests. These tests can spot problems early.  For everyone  ?? Cholesterol. Have the fat (cholesterol) in your blood tested after age 97. Your doctor will tell you how often to have this done based on your age, family history, or other things that can increase your risk for heart disease.  ?? Blood pressure. Have your blood pressure checked during a routine doctor visit. Your doctor will tell you how often to check your blood pressure based on your age, your blood pressure results, and other factors.  ?? Vision. Talk with your doctor about how often to have a glaucoma test.  ?? Diabetes.  Ask your doctor whether you should have tests for diabetes.  ?? Colon cancer. Your risk for colorectal cancer gets higher as you get older. Some experts say that adults should start regular screening at age 91 and stop at age 79. Others say to start before age 47 or continue after age 14. Talk with your doctor about your risk and when to start and stop screening.  For women  ?? Breast exam and mammogram. Talk to your doctor about when you should have a clinical breast exam and  a mammogram. Medical experts differ on whether and how often women under 50 should have these tests. Your doctor can help you decide what is right for you.  ?? Cervical cancer screening test and pelvic exam. Begin with a Pap test at age 60. The test often is part of a pelvic exam. Starting at age 27, you may choose to have a Pap test, an HPV test, or both tests at the same time (called co-testing). Talk with your doctor about how often to have testing.  ?? Tests for sexually transmitted infections (STIs). Ask whether you should have tests for STIs. You may be at risk if you have sex with more than one person, especially if your partners do not wear condoms.  For men  ?? Tests for sexually transmitted infections (STIs). Ask whether you should have tests for STIs. You may be at risk if you have sex with more than one person, especially if you do not wear a condom.  ?? Testicular cancer exam. Ask your doctor whether you should check your testicles regularly.  ?? Prostate exam. Talk to your doctor about whether you should have a blood test (called a PSA test) for prostate cancer. Experts differ on whether and when men should have this test. Some experts suggest it if you are older than 12 and are African-American or have a father or brother who got prostate cancer when he was younger than 73.  When should you call for help?  Watch closely for changes in your health, and be sure to contact your doctor if you have any problems or symptoms that concern  you.  Where can you learn more?  Go to [email or web address] and sign in to your MyChart account. Enter P072 in the Search Health Information box to learn more about "Well Visit, Ages 12 to 1: Care Instructions."     If you do not have an account, please click on the "Sign Up Now" link.  Current as of: April 25, 2018  Content Version: 12.3  ?? 2006-2019 [employer]. Care instructions adapted under license by [facility]. If you have questions about a medical condition or this instruction, always ask your healthcare professional. [employer] disclaims any warranty or liability for your use of this information.         Patient Education        DASH Diet: Care Instructions  Your Care Instructions    The DASH diet is an eating plan that can help lower your blood pressure. DASH stands for Dietary Approaches to Stop Hypertension. Hypertension is high blood pressure.  The DASH diet focuses on eating foods that are high in calcium, potassium, and magnesium. These nutrients can lower blood pressure. The foods that are highest in these nutrients are fruits, vegetables, low-fat dairy products, nuts, seeds, and legumes. But taking calcium, potassium, and magnesium supplements instead of eating foods that are high in those nutrients does not have the same effect. The DASH diet also includes whole grains, fish, and poultry.  The DASH diet is one of several lifestyle changes your doctor may recommend to lower your high blood pressure. Your doctor may also want you to decrease the amount of sodium in your diet. Lowering sodium while following the DASH diet can lower blood pressure even further than just the DASH diet alone.  Follow-up care is a key part of your treatment and safety. Be sure to make and go to all appointments, and call your doctor if you are having problems. It's  also a good idea to know your test results and keep a list of the medicines you take.  How can you care for  yourself at home?  Following the DASH diet  ?? Eat 4 to 5 servings of fruit each day. A serving is 1 medium-sized piece of fruit, ?? cup chopped or canned fruit, 1/4 cup dried fruit, or 4 ounces (?? cup) of fruit juice. Choose fruit more often than fruit juice.  ?? Eat 4 to 5 servings of vegetables each day. A serving is 1 cup of lettuce or raw leafy vegetables, ?? cup of chopped or cooked vegetables, or 4 ounces (?? cup) of vegetable juice. Choose vegetables more often than vegetable juice.  ?? Get 2 to 3 servings of low-fat and fat-free dairy each day. A serving is 8 ounces of milk, 1 cup of yogurt, or 1 ?? ounces of cheese.  ?? Eat 6 to 8 servings of grains each day. A serving is 1 slice of bread, 1 ounce of dry cereal, or ?? cup of cooked rice, pasta, or cooked cereal. Try to choose whole-grain products as much as possible.  ?? Limit lean meat, poultry, and fish to 2 servings each day. A serving is 3 ounces, about the size of a deck of cards.  ?? Eat 4 to 5 servings of nuts, seeds, and legumes (cooked dried beans, lentils, and split peas) each week. A serving is 1/3 cup of nuts, 2 tablespoons of seeds, or ?? cup of cooked beans or peas.  ?? Limit fats and oils to 2 to 3 servings each day. A serving is 1 teaspoon of vegetable oil or 2 tablespoons of salad dressing.  ?? Limit sweets and added sugars to 5 servings or less a week. A serving is 1 tablespoon jelly or jam, ?? cup sorbet, or 1 cup of lemonade.  ?? Eat less than 2,300 milligrams (mg) of sodium a day. If you limit your sodium to 1,500 mg a day, you can lower your blood pressure even more.  Tips for success  ?? Start small. Do not try to make dramatic changes to your diet all at once. You might feel that you are missing out on your favorite foods and then be more likely to not follow the plan. Make small changes, and stick with them. Once those changes become habit, add a few more changes.  ?? Try some of the following:  ? Make it a goal to eat a fruit or vegetable at every  meal and at snacks. This will make it easy to get the recommended amount of fruits and vegetables each day.  ? Try yogurt topped with fruit and nuts for a snack or healthy dessert.  ? Add lettuce, tomato, cucumber, and onion to sandwiches.  ? Combine a ready-made pizza crust with low-fat mozzarella cheese and lots of vegetable toppings. Try using tomatoes, squash, spinach, broccoli, carrots, cauliflower, and onions.  ? Have a variety of cut-up vegetables with a low-fat dip as an appetizer instead of chips and dip.  ? Sprinkle sunflower seeds or chopped almonds over salads. Or try adding chopped walnuts or almonds to cooked vegetables.  ? Try some vegetarian meals using beans and peas. Add garbanzo or kidney beans to salads. Make burritos and tacos with mashed pinto beans or black beans.  Where can you learn more?  Go to [email or web address] and sign in to your MyChart account. Enter [phone] in the Search Health Information box to learn more about "DASH Diet: Care  Instructions."     If you do not have an account, please click on the "Sign Up Now" link.  Current as of: December 12, 2017  Content Version: 12.3  ?? 2006-2019 [employer]. Care instructions adapted under license by [facility]. If you have questions about a medical condition or this instruction, always ask your healthcare professional. [employer] disclaims any warranty or liability for your use of this information.         Patient Education        Learning About High Cholesterol  What is high cholesterol?    Cholesterol is a type of fat in your blood. It is needed for many body functions, such as making new cells. Cholesterol is made by your body. It also comes from food you eat.  If you have too much cholesterol, it starts to build up in your arteries. This is called hardening of the arteries, or atherosclerosis. High cholesterol raises your risk of a heart attack and stroke.  There are different types of  cholesterol. LDL is the "bad" cholesterol. High LDL can raise your risk for heart disease, heart attack, and stroke. HDL is the "good" cholesterol. High HDL is linked with a lower risk for heart disease, heart attack, and stroke.  Your cholesterol levels help your doctor find out your risk for having a heart attack or stroke.  How can you prevent high cholesterol?  A heart-healthy lifestyle can help you prevent high cholesterol. This lifestyle helps lower your risk for a heart attack and stroke.  ?? Eat heart-healthy foods.  ? Eat fruits, vegetables, whole grains (like oatmeal), dried beans and peas, nuts and seeds, soy products (like tofu), and fat-free or low-fat dairy products.  ? Replace butter, margarine, and hydrogenated or partially hydrogenated oils with olive and canola oils. (Canola oil margarine without trans fat is fine.)  ? Replace red meat with fish, poultry, and soy protein (like tofu).  ? Limit processed and packaged foods like chips, crackers, and cookies.  ?? Be active. Exercise can improve your cholesterol level. Get at least 30 minutes of exercise on most days of the week. Walking is a good choice. You also may want to do other activities, such as running, swimming, cycling, or playing tennis or team sports.  ?? Stay at a healthy weight. Lose weight if you need to.  ?? Don't smoke. If you need help quitting, talk to your doctor about stop-smoking programs and medicines. These can increase your chances of quitting for good.  How is high cholesterol treated?  The goal of treatment is to reduce your chances of having a heart attack or stroke. The goal is not to lower your cholesterol numbers only.  ?? You may make lifestyle changes, such as eating healthy foods, not smoking, losing weight, and being more active.  ?? You may have to take medicine.  Follow-up care is a key part of your treatment and safety. Be sure to make and go to all appointments, and call your doctor if you are having problems. It's  also a good idea to know your test results and keep a list of the medicines you take.  Where can you learn more?  Go to [email or web address] and sign in to your MyChart account. Enter [phone] in the Search Health Information box to learn more about "Learning About High Cholesterol."     If you do not have an account, please click on the "Sign Up Now" link.  Current as of: December 12, 2017  Content Version: 12.3  ?? 2006-2019 [employer]. Care instructions adapted under license by [facility]. If you have questions about a medical condition or this instruction, always ask your healthcare professional. [employer] disclaims any warranty or liability for your use of this information.

## 2018-11-12 NOTE — Progress Notes (Signed)
[facility]  [address], [address]  [place], [place] [address]    Subjective:    Chief Complaint   Patient presents with   ??? Medication Refill     losartan, omeprazole, escitalopram    ??? Other     pt wants to talk about depression, x 1 month,       HPI  Concerns today:  Here for physical, not fasting for labs    Stress and depression:  Wife is [redacted] wks pregnant  Found out last month that baby has Trisomy 34 with multiple complications and is unlikely to make it to term  Pt states "each day we are like, is today the day? Or if the baby makes it to delivery then the baby probably wont make it long"  Pt is tearful in office  Dr Marciano Sequin GYN has filled out FMLA for him as he is sole financial provider  Works as a Production designer, theatre/television/film at [employer]  He and wife go to support groups  Work Counsellor which he has seen a few times  Has 2 other children at home, 8 and 9yo -- "trying to explain it to them has been really hard bc they were really excited for another sibling"  Started smoking again d/t the stress-- "only a couple a day if that"  Oversleeping  See exercise, diet, etc below  Currently on lexapro 20mg  - has been on this for years for anxiety  Would like to know if we can increase or add med    Labs:  Last labs (cbc, cmp, lipid) 10/19/17- see result note below:  Notes recorded by Loanne Drilling, MD on 10/19/2017 at 4:36 PM EST  The blood count is normal  There are a few labs that are slightly out of range, but that should not be a concern  The electrolytes, and kidneys are normal  Sugar is normal  The liver enzymes are too high. ??This could be due to fatty liver. ??The treatment for that is weight loss. ??Stop all alcohol and antiinflammatory drugs and work hard on weight loss. ??Then we should recheck this lab in 4 weeks. ??If the numbers stay high, or are going up, we will need to get an ultrasound of the liver next.  The LDL is too high. ??The goal is to be below 100. ??I don't think you can  get this to goal on your own. ??You should really go on a statin drug to get this down and decrease the risk for heart disease and stroke  Call the office if you are interested in starting medication to get this down and lower your risk    Pt did not repeat hepatic labs or start statin  Would like to discuss statin after reviewing next labs    Prescription medications:  Amlodipine 10mg  1 tab once daily for HTN  Losartan-HCTZ 100-25mg  1 tab once daily for HTN  Metoprolol 25mg  1 tab BID for HTN  Escitalopram 20mg  1 tab once daily for anxiety  Feels well managed on these without complications or s/e    If hx htn, checking BPs at Home:  Pt states he checks at home which is normally 150s/70-90s  No sx high bp  Cutting down on smoking    If diabetic, checking BS at home:   n/a    ER Visits in the Last Yr:  none    Last Labs:  Lab Results   Component Value Date    CHOL  271 (A) 10/19/2017     Lab Results   Component Value Date    TRIG 146 10/19/2017     Lab Results   Component Value Date    HDL 56 10/19/2017     Lab Results   Component Value Date    LDLCHOLESTEROL 186 (A) 10/19/2017     No results found for: LABVLDL, VLDL  Lab Results   Component Value Date    CHOLHDLRATIO 5 10/19/2017       Lab Results   Component Value Date    NA 140 10/19/2017    K 4.1 10/19/2017    CL 97 (L) 10/19/2017    CO2 30 10/19/2017    BUN 16 10/19/2017    CREATININE 1.00 10/19/2017    GLUCOSE 92 10/19/2017    CALCIUM 10.8 (H) 10/19/2017    PROT 8.4 (H) 10/19/2017    LABALBU 5.3 (H) 10/19/2017    BILITOT 0.9 10/19/2017    ALKPHOS 100 10/19/2017    AST 148 (H) 10/19/2017    ALT 213 (H) 10/19/2017       Specialists:  none    STI testing? declines    Last Colonoscopy:  never     Last Prostate:  never    Screenings:  Eye: last visit within 60yrs, no issues  Dental: last visit within the year, no issues  Exercise: nothing really, states elliptical occasionally at home  Diet: 2-3 meals: breakfast on weekends- eggs, toast, cereal; lunch- sandwich from  vending machine, "anything in the fridge from work at [employer]"; dinner- Hello Fresh meals, protein, veg, starch  Beverages: water, occasional juice or coffee; etoh use- weekends mostly, but recently with stress has been drinking more 1/2 bottle liquor 1L  BMs: regular daily, no constipation, diarrhea on and off for the last few years, no blood, has never seen GI  Sleep: works 3rd shift, 10-12hrs, no problems falling or staying asleep; wants to sleep all the time    Pt had home sleep study 12/03/17 that showed no apnea but had nocturnal hypoxia  Has never seen sleep specialist  This along with HLD we will discuss at next appt    Immunization History   Administered Date(s) Administered   ??? Influenza, Quadv, 6 mo and older, IM, PF (Flulaval, Fluarix) 10/19/2017   ??? Pneumococcal Polysaccharide (Pneumovax23) 10/19/2017   ??? Tdap (Boostrix, Adacel) 10/19/2017       Due:  flu vaccine- completed at work this fall       Review of Systems   Constitutional: Negative for activity change, appetite change, chills, diaphoresis, fatigue and fever.   HENT: Negative for congestion, ear pain, postnasal drip, rhinorrhea, sinus pressure, sore throat, trouble swallowing and voice change.    Eyes: Negative for visual disturbance.   Respiratory: Negative for cough, chest tightness, shortness of breath and wheezing.    Cardiovascular: Negative for chest pain, palpitations and leg swelling.   Gastrointestinal: Positive for abdominal pain and diarrhea. Negative for abdominal distention, anal bleeding, blood in stool, constipation, nausea, rectal pain and vomiting.        Chronic diarrhea; occasional RUQ pain the last few months - unsure about alleviating or aggravating factors; no self tx or associating sx    Genitourinary: Negative for decreased urine volume, difficulty urinating, discharge, dysuria, flank pain, frequency, hematuria, penile pain, penile swelling, scrotal swelling, testicular pain and urgency.   Musculoskeletal: Negative for  arthralgias, myalgias and neck pain.   Skin: Negative for color change, pallor, rash and wound.  Neurological: Negative for dizziness, tremors, syncope, weakness, light-headedness, numbness and headaches.   Hematological: Does not bruise/bleed easily.   Psychiatric/Behavioral: Negative for behavioral problems, confusion, decreased concentration, dysphoric mood, sleep disturbance and suicidal ideas. The patient is not nervous/anxious.         See hpi           Social History     Socioeconomic History   ??? Marital status: Married     Spouse name: Not on file   ??? Number of children: Not on file   ??? Years of education: Not on file   ??? Highest education level: Not on file   Occupational History   ??? Not on file   Social Needs   ??? Financial resource strain: Not on file   ??? Food insecurity     Worry: Not on file     Inability: Not on file   ??? Transportation needs     Medical: Not on file     Non-medical: Not on file   Tobacco Use   ??? Smoking status: Current Every Day Smoker     Packs/day: 0.25     Types: Cigarettes   ??? Smokeless tobacco: Never Used   ??? Tobacco comment: pt slowly cutting down on usage   Substance and Sexual Activity   ??? Alcohol use: Yes     Comment: social/holidays   ??? Drug use: No   ??? Sexual activity: Not on file   Lifestyle   ??? Physical activity     Days per week: Not on file     Minutes per session: Not on file   ??? Stress: Not on file   Relationships   ??? Social Wellsite geologist on phone: Not on file     Gets together: Not on file     Attends religious service: Not on file     Active member of club or organization: Not on file     Attends meetings of clubs or organizations: Not on file     Relationship status: Not on file   ??? Intimate partner violence     Fear of current or ex partner: Not on file     Emotionally abused: Not on file     Physically abused: Not on file     Forced sexual activity: Not on file   Other Topics Concern   ??? Not on file   Social History Narrative   ??? Not on file     Past  Medical History:   Diagnosis Date   ??? Allergic rhinitis    ??? Anxiety    ??? Hyperlipidemia    ??? Hypertension      History reviewed. No pertinent surgical history.  Allergies   Allergen Reactions   ??? Lactose Intolerance (Gi)      Family History   Problem Relation Age of Onset   ??? No Known Problems Mother    ??? No Known Problems Father        Objective:       Vitals:    11/12/18 0702   BP: (!) 148/93   Pulse: 91   Temp: 98.1 ??F (36.7 ??C)   SpO2: 96%   Weight: (!) 335 lb 12.8 oz (152.3 kg)   Height: 6' (1.829 m)     [facility] Scores 11/12/2018 10/19/2017 08/08/2017   PHQ2 Score 5 0 3   PHQ9 Score 17 0 12     Interpretation of Total Score Depression Severity: 1-4 =  Minimal depression, 5-9 = Mild depression, 10-14 = Moderate depression, 15-19 = Moderately severe depression, 20-27 = Severe depression        Current Outpatient Medications   Medication Sig Dispense Refill   ??? escitalopram (LEXAPRO) 20 MG tablet Take 1 tablet by mouth daily 90 tablet 4   ??? losartan-hydrochlorothiazide (HYZAAR) 100-25 MG per tablet Take 1 tablet by mouth daily 90 tablet 4   ??? omeprazole (PRILOSEC) 20 MG delayed release capsule Take 1 capsule by mouth daily 90 capsule 4   ??? metoprolol tartrate (LOPRESSOR) 50 MG tablet Take 1 tablet by mouth 2 times daily 180 tablet 4   ??? buPROPion (WELLBUTRIN XL) 150 MG extended release tablet Take 1 tablet by mouth every morning 90 tablet 3   ??? amLODIPine (NORVASC) 10 MG tablet Take 1 tablet by mouth daily 90 tablet 4   ??? cetirizine (ZYRTEC) 10 MG tablet Take 10 mg by mouth daily     ??? ibuprofen (ADVIL;MOTRIN) 200 MG tablet Take 200 mg by mouth every 6 hours as needed for Pain       No current facility-administered medications for this visit.        Physical Exam  Vitals signs reviewed.   Constitutional:       General: He is not in acute distress.     Appearance: Normal appearance. He is well-developed. He is not ill-appearing, toxic-appearing or diaphoretic.   HENT:      Head: Normocephalic and atraumatic.      Right  Ear: Tympanic membrane, ear canal and external ear normal.      Left Ear: Tympanic membrane, ear canal and external ear normal.      Nose: Nose normal. No congestion or rhinorrhea.      Mouth/Throat:      Lips: Pink.      Mouth: Mucous membranes are moist.      Pharynx: Oropharynx is clear. Uvula midline. No pharyngeal swelling, oropharyngeal exudate, posterior oropharyngeal erythema or uvula swelling.      Tonsils: No tonsillar exudate or tonsillar abscesses.   Eyes:      General: Lids are normal. No scleral icterus.        Right eye: No discharge.         Left eye: No discharge.      Extraocular Movements: Extraocular movements intact.      Conjunctiva/sclera: Conjunctivae normal.      Pupils: Pupils are equal, round, and reactive to light.   Neck:      Musculoskeletal: Full passive range of motion without pain, normal range of motion and neck supple. No neck rigidity or muscular tenderness.      Thyroid: No thyroid mass, thyromegaly or thyroid tenderness.      Vascular: No carotid bruit.   Cardiovascular:      Rate and Rhythm: Normal rate and regular rhythm.      Pulses: Normal pulses.           Radial pulses are 2+ on the right side and 2+ on the left side.        Posterior tibial pulses are 2+ on the right side and 2+ on the left side.      Heart sounds: Normal heart sounds. No murmur. No friction rub. No gallop.       Comments: Capillary refill <3 sec  Pulmonary:      Effort: Pulmonary effort is normal. No respiratory distress.      Breath sounds: Normal breath sounds. No stridor.  No decreased breath sounds, wheezing, rhonchi or rales.   Abdominal:      General: Bowel sounds are normal. There is no distension.      Palpations: Abdomen is soft. There is no hepatomegaly, splenomegaly or mass.      Tenderness: There is no abdominal tenderness. There is no right CVA tenderness, left CVA tenderness, guarding or rebound.      Hernia: No hernia is present.   Musculoskeletal: Normal range of motion.         General:  No tenderness.   Lymphadenopathy:      Head:      Right side of head: No submental, submandibular, tonsillar, preauricular, posterior auricular or occipital adenopathy.      Left side of head: No submental, submandibular, tonsillar, preauricular, posterior auricular or occipital adenopathy.      Cervical: No cervical adenopathy.   Skin:     General: Skin is warm and dry.      Capillary Refill: Capillary refill takes less than 2 seconds.      Coloration: Skin is not jaundiced or pale.      Findings: No bruising, erythema or lesion.      Nails: There is no clubbing.     Neurological:      General: No focal deficit present.      Mental Status: He is alert and oriented to person, place, and time.   Psychiatric:         Mood and Affect: Mood normal.         Speech: Speech normal.         Behavior: Behavior normal. Behavior is cooperative.           Assessment:         Diagnosis Orders   1. Routine adult health maintenance  CBC    Comprehensive Metabolic Panel    Lipid Panel   2. Hypertension, unspecified type  CBC    Comprehensive Metabolic Panel    Lipid Panel    losartan-hydrochlorothiazide (HYZAAR) 100-25 MG per tablet    metoprolol tartrate (LOPRESSOR) 50 MG tablet   3. Hyperlipidemia, unspecified hyperlipidemia type  CBC    Comprehensive Metabolic Panel    Lipid Panel   4. Anxiety  CBC    Comprehensive Metabolic Panel    escitalopram (LEXAPRO) 20 MG tablet   5. Snoring  CBC    Comprehensive Metabolic Panel   6. Nocturnal hypoxia  CBC    Comprehensive Metabolic Panel   7. Medication management  CBC    Comprehensive Metabolic Panel   8. Hypersomnia     9. Elevated LFTs  Hepatitis Panel, Acute    US ABDOMEN LIMITED    Amb External Referral To Gastroenterology   10. RUQ pain  US ABDOMEN LIMITED    Amb External Referral To Gastroenterology   11. Chronic diarrhea  Amb External Referral To Gastroenterology   12. Stress     13. Depressed mood     14. Grief reaction           Plan:     D/t multiple acute concerns today, will  discuss HLD/statin med management and possible referral to sleep specialist for nocturnal hypoxia with snoring without sleep apnea    Goals        Patient Stated    ??? Patient Stated (pt-stated)      Decrease BP    Barriers: diet  Plan for overcoming my barriers: improve diet, cooking fresh foods, lose weight  Confidence:  4/10  Anticipated Goal Completion Date: 04/2018            Patient Instructions   Annual physical/wellness visit:  Labs ordered today- complete when fasting  Will msg/call with results    Vaccines today- none due    Screening(s) ordered:  Colon cancer screening- start age 50   Breast cancer screening-   Cervical cancer screening- n/a  Prostate cancer screening- start age 58  Osteoporosis/bone density screening- n/a  AAA screening- start age 20 for males and high risk females   Lung cancer screening- start age 57 for smokers    *It is always recommended for any orders (labs, imaging, procedures, etc) to check with your insurance provider for expected costs/expenses to you    Stress, depression:  Continue taking your lexapro 20mg  1 tab once daily  Start taking wellbutrin 150mg  1 tab once daily  Discussed s/e and timeframe of effectiveness    Continue seeing your support groups and work wellness counselor    Return in 4 weeks for follow up    Elevated blood pressure:  Continue taking your:  Amlodipine 10mg  1 tab once daily   Losartan-HCTZ 100-25mg  1 tab once daily    Stop taking your Metoprolol 25mg   Start taking metoprolol 50mg  1 tab twice daily    Check BPs/HRs for 1 week (make sure your cuff at home is accurate by checking it against a store cuff)  Make sure you take the reading at least 1 hour after BP meds if on medication  Make sure to get BP readings at various times throughout the day  IN ONE WEEK---Input your results on mychart or Call us with your results   ??  - MESSAGE THAT DAY (DONT WAIT A WEEK) if BP 180/100 or higher  - TO ER IF BP 180/100 or higher, or if you develop symptoms of headache,  weakness, numbness/tingling, chest pain, short of breath, heart racing, leg swelling, vision changes  ??    Chronic diarrhea and recent right upper abdominal pain:  Labs ordered (above)  [place] ordered of the abdomen- call [phone] to schedule    Referral placed to GI- you call to schedule:  [facility]  [address]ris Carnes [place], [place] [address]  [phone]    ---------------------  Below are general recommendations for your well visit:    Exercise:  - Recommend cardio 4-5 days per week; 30 minutes each day    Diet:  - Breakfast with fiber (4 grams) and whole grains (check ingredient list; "whole wheat" should be first listed ingredient), protein (eggs, peanut butter OK)  - Lunch with protein and 1 cup vegetables  - Dinner with protein and 1 cup vegetables  - 2 servings of fruit per day  - 2 servings of dairy per day (milk, cheese, yogurt, cottage cheese)  - Snacks with protein, fiber, healthy fats (examples: hard boiled eggs, 1/4 cup nuts, hummus, vegetables, fruit, yogurt (greek has high protein content), triscuits or popcorn (stay within serving size)  - Drink water throughout day (8 glasses per day)  - Limit alcohol intake to 1 drink per day for women and 2 drinks per day for men (1 drink=12oz beer or 5oz wine or 1 1/2oz liquor)    Healthy Lifestyle:  - Use sunscreen  - Wear seatbelt  - See eye doctor every 1-2 years  - See dentist every 6-12 months  - Physical every 1 year    Immunizations:  - Tetanus every 10 years (every  5 years if dirty cut)  - Flu vaccine recommended every flu season     Patient Education        Well Visit, Ages 81 to 104: Care Instructions  Your Care Instructions    Physical exams can help you stay healthy. Your doctor has checked your overall health and may have suggested ways to take good care of yourself. He or she also may have recommended tests. At home, you can help prevent illness with healthy eating, regular exercise, and other steps.  Follow-up care is a key  part of your treatment and safety. Be sure to make and go to all appointments, and call your doctor if you are having problems. It's also a good idea to know your test results and keep a list of the medicines you take.  How can you care for yourself at home?  ?? Reach and stay at a healthy weight. This will lower your risk for many problems, such as obesity, diabetes, heart disease, and high blood pressure.  ?? Get at least 30 minutes of physical activity on most days of the week. Walking is a good choice. You also may want to do other activities, such as running, swimming, cycling, or playing tennis or team sports. Discuss any changes in your exercise program with your doctor.  ?? Do not smoke or allow others to smoke around you. If you need help quitting, talk to your doctor about stop-smoking programs and medicines. These can increase your chances of quitting for good.  ?? Talk to your doctor about whether you have any risk factors for sexually transmitted infections (STIs). Having one sex partner (who does not have STIs and does not have sex with anyone else) is a good way to avoid these infections.  ?? Use birth control if you do not want to have children at this time. Talk with your doctor about the choices available and what might be best for you.  ?? Protect your skin from too much sun. When you're outdoors from 10 a.m. to 4 p.m., stay in the shade or cover up with clothing and a hat with a wide brim. Wear sunglasses that block UV rays. Even when it's cloudy, put broad-spectrum sunscreen (SPF 30 or higher) on any exposed skin.  ?? See a dentist one or two times a year for checkups and to have your teeth cleaned.  ?? Wear a seat belt in the car.  Follow your doctor's advice about when to have certain tests. These tests can spot problems early.  For everyone  ?? Cholesterol. Have the fat (cholesterol) in your blood tested after age 64. Your doctor will tell you how often to have this done based on your age, family  history, or other things that can increase your risk for heart disease.  ?? Blood pressure. Have your blood pressure checked during a routine doctor visit. Your doctor will tell you how often to check your blood pressure based on your age, your blood pressure results, and other factors.  ?? Vision. Talk with your doctor about how often to have a glaucoma test.  ?? Diabetes. Ask your doctor whether you should have tests for diabetes.  ?? Colon cancer. Your risk for colorectal cancer gets higher as you get older. Some experts say that adults should start regular screening at age 65 and stop at age 63. Others say to start before age 39 or continue after age 76. Talk with your doctor about your risk and when to start  and stop screening.  For women  ?? Breast exam and mammogram. Talk to your doctor about when you should have a clinical breast exam and a mammogram. Medical experts differ on whether and how often women under 50 should have these tests. Your doctor can help you decide what is right for you.  ?? Cervical cancer screening test and pelvic exam. Begin with a Pap test at age 14. The test often is part of a pelvic exam. Starting at age 9, you may choose to have a Pap test, an HPV test, or both tests at the same time (called co-testing). Talk with your doctor about how often to have testing.  ?? Tests for sexually transmitted infections (STIs). Ask whether you should have tests for STIs. You may be at risk if you have sex with more than one person, especially if your partners do not wear condoms.  For men  ?? Tests for sexually transmitted infections (STIs). Ask whether you should have tests for STIs. You may be at risk if you have sex with more than one person, especially if you do not wear a condom.  ?? Testicular cancer exam. Ask your doctor whether you should check your testicles regularly.  ?? Prostate exam. Talk to your doctor about whether you should have a blood test (called a PSA test) for prostate cancer. Experts  differ on whether and when men should have this test. Some experts suggest it if you are older than 64 and are African-American or have a father or brother who got prostate cancer when he was younger than 67.  When should you call for help?  Watch closely for changes in your health, and be sure to contact your doctor if you have any problems or symptoms that concern you.  Where can you learn more?  Go to [email or web address] and sign in to your MyChart account. Enter P072 in the Search Health Information box to learn more about "Well Visit, Ages 58 to 26: Care Instructions."     If you do not have an account, please click on the "Sign Up Now" link.  Current as of: April 25, 2018  Content Version: 12.3  ?? 2006-2019 [employer]. Care instructions adapted under license by [facility]. If you have questions about a medical condition or this instruction, always ask your healthcare professional. [employer] disclaims any warranty or liability for your use of this information.         Patient Education        DASH Diet: Care Instructions  Your Care Instructions    The DASH diet is an eating plan that can help lower your blood pressure. DASH stands for Dietary Approaches to Stop Hypertension. Hypertension is high blood pressure.  The DASH diet focuses on eating foods that are high in calcium, potassium, and magnesium. These nutrients can lower blood pressure. The foods that are highest in these nutrients are fruits, vegetables, low-fat dairy products, nuts, seeds, and legumes. But taking calcium, potassium, and magnesium supplements instead of eating foods that are high in those nutrients does not have the same effect. The DASH diet also includes whole grains, fish, and poultry.  The DASH diet is one of several lifestyle changes your doctor may recommend to lower your high blood pressure. Your doctor may also want you to decrease the amount of sodium in your diet. Lowering  sodium while following the DASH diet can lower blood pressure even further than just the DASH diet alone.  Follow-up care is  a key part of your treatment and safety. Be sure to make and go to all appointments, and call your doctor if you are having problems. It's also a good idea to know your test results and keep a list of the medicines you take.  How can you care for yourself at home?  Following the DASH diet  ?? Eat 4 to 5 servings of fruit each day. A serving is 1 medium-sized piece of fruit, ?? cup chopped or canned fruit, 1/4 cup dried fruit, or 4 ounces (?? cup) of fruit juice. Choose fruit more often than fruit juice.  ?? Eat 4 to 5 servings of vegetables each day. A serving is 1 cup of lettuce or raw leafy vegetables, ?? cup of chopped or cooked vegetables, or 4 ounces (?? cup) of vegetable juice. Choose vegetables more often than vegetable juice.  ?? Get 2 to 3 servings of low-fat and fat-free dairy each day. A serving is 8 ounces of milk, 1 cup of yogurt, or 1 ?? ounces of cheese.  ?? Eat 6 to 8 servings of grains each day. A serving is 1 slice of bread, 1 ounce of dry cereal, or ?? cup of cooked rice, pasta, or cooked cereal. Try to choose whole-grain products as much as possible.  ?? Limit lean meat, poultry, and fish to 2 servings each day. A serving is 3 ounces, about the size of a deck of cards.  ?? Eat 4 to 5 servings of nuts, seeds, and legumes (cooked dried beans, lentils, and split peas) each week. A serving is 1/3 cup of nuts, 2 tablespoons of seeds, or ?? cup of cooked beans or peas.  ?? Limit fats and oils to 2 to 3 servings each day. A serving is 1 teaspoon of vegetable oil or 2 tablespoons of salad dressing.  ?? Limit sweets and added sugars to 5 servings or less a week. A serving is 1 tablespoon jelly or jam, ?? cup sorbet, or 1 cup of lemonade.  ?? Eat less than 2,300 milligrams (mg) of sodium a day. If you limit your sodium to 1,500 mg a day, you can lower your blood pressure even more.  Tips for  success  ?? Start small. Do not try to make dramatic changes to your diet all at once. You might feel that you are missing out on your favorite foods and then be more likely to not follow the plan. Make small changes, and stick with them. Once those changes become habit, add a few more changes.  ?? Try some of the following:  ? Make it a goal to eat a fruit or vegetable at every meal and at snacks. This will make it easy to get the recommended amount of fruits and vegetables each day.  ? Try yogurt topped with fruit and nuts for a snack or healthy dessert.  ? Add lettuce, tomato, cucumber, and onion to sandwiches.  ? Combine a ready-made pizza crust with low-fat mozzarella cheese and lots of vegetable toppings. Try using tomatoes, squash, spinach, broccoli, carrots, cauliflower, and onions.  ? Have a variety of cut-up vegetables with a low-fat dip as an appetizer instead of chips and dip.  ? Sprinkle sunflower seeds or chopped almonds over salads. Or try adding chopped walnuts or almonds to cooked vegetables.  ? Try some vegetarian meals using beans and peas. Add garbanzo or kidney beans to salads. Make burritos and tacos with mashed pinto beans or black beans.  Where can you learn more?  Go to [email or web address] and sign in to your MyChart account. Enter [id number] in the Search Health Information box to learn more about "DASH Diet: Care Instructions."     If you do not have an account, please click on the "Sign Up Now" link.  Current as of: December 12, 2017  Content Version: 12.3  ?? 2006-2019 [employer]. Care instructions adapted under license by [facility]. If you have questions about a medical condition or this instruction, always ask your healthcare professional. [employer]wise, Inc[employer] disclaims any warranty or liability for your use of this information.         Patient Education        Learning About High Cholesterol  What is high cholesterol?    Cholesterol is a type of fat in your  blood. It is needed for many body functions, such as making new cells. Cholesterol is made by your body. It also comes from food you eat.  If you have too much cholesterol, it starts to build up in your arteries. This is called hardening of the arteries, or atherosclerosis. High cholesterol raises your risk of a heart attack and stroke.  There are different types of cholesterol. LDL is the "bad" cholesterol. High LDL can raise your risk for heart disease, heart attack, and stroke. HDL is the "good" cholesterol. High HDL is linked with a lower risk for heart disease, heart attack, and stroke.  Your cholesterol levels help your doctor find out your risk for having a heart attack or stroke.  How can you prevent high cholesterol?  A heart-healthy lifestyle can help you prevent high cholesterol. This lifestyle helps lower your risk for a heart attack and stroke.  ?? Eat heart-healthy foods.  ? Eat fruits, vegetables, whole grains (like oatmeal), dried beans and peas, nuts and seeds, soy products (like tofu), and fat-free or low-fat dairy products.  ? Replace butter, margarine, and hydrogenated or partially hydrogenated oils with olive and canola oils. (Canola oil margarine without trans fat is fine.)  ? Replace red meat with fish, poultry, and soy protein (like tofu).  ? Limit processed and packaged foods like chips, crackers, and cookies.  ?? Be active. Exercise can improve your cholesterol level. Get at least 30 minutes of exercise on most days of the week. Walking is a good choice. You also may want to do other activities, such as running, swimming, cycling, or playing tennis or team sports.  ?? Stay at a healthy weight. Lose weight if you need to.  ?? Don't smoke. If you need help quitting, talk to your doctor about stop-smoking programs and medicines. These can increase your chances of quitting for good.  How is high cholesterol treated?  The goal of treatment is to reduce your chances of having a heart attack or  stroke. The goal is not to lower your cholesterol numbers only.  ?? You may make lifestyle changes, such as eating healthy foods, not smoking, losing weight, and being more active.  ?? You may have to take medicine.  Follow-up care is a key part of your treatment and safety. Be sure to make and go to all appointments, and call your doctor if you are having problems. It's also a good idea to know your test results and keep a list of the medicines you take.  Where can you learn more?  Go to [email or web address] and sign in to your MyChart account. Enter [id number] in the Search Health Information box to learn more about "Learning About High Cholesterol."  If you do not have an account, please click on the "Sign Up Now" link.  Current as of: December 12, 2017  Content Version: 12.3  ?? 2006-2019 [employer], [employer]. Care instructions adapted under license by [facility]. If you have questions about a medical condition or this instruction, always ask your healthcare professional. [employer], Inc[employer] disclaims any warranty or liability for your use of this information.             Bridgette Habermann, CNP

## 2018-11-15 NOTE — Telephone Encounter (Signed)
Pt was in office 11/12/2018 for appt, labs were ordered but do not look like they were completed.     I LM advised pt lab orders are available to be completed from his 11/12/2018 visit, and that he will need to be fasting for 8 hrs prior to getting labs done. Also advised pt once labs results were received and reviewed by the provider we would then be able to send over the results to the doctor he requested.

## 2018-11-15 NOTE — Telephone Encounter (Signed)
Name of Caller: Lenon Oms phone number: [phone]    Relationship to Patient: Keith Gallegos    Provider: Jean Rosenthal    Practice:  [facility]     Chief Complaint/Reason for Call: Pt is scheduled to see Dr Keith Gallegos tomorrow for elevated live enzymes and she needs blood work showing this. Pls send to [phone]. Thank you     Best time of day caller can be reached: any      Patient advised that office/PCP has 24-48 business hours to return their call: no

## 2018-11-20 NOTE — Telephone Encounter (Signed)
This is not our patient. Please direct to correct office.

## 2018-11-20 NOTE — Telephone Encounter (Signed)
Name of Caller: Sherryl Manges phone number: [phone]    Relationship to Patient: Laurette Schimke office    Provider: Jean Rosenthal    Practice:  [facility]     Chief Complaint/Reason for Call: [place] Lions from Dr.Sharmas office states that the pt no showed his first appointment last Friday and rescheduled for today and no showed again. [place] [facility] states they will no longer see this patient. Please Advise.     Best time of day caller can be reached: AM       Patient advised that office/PCP has 24-48 business hours to return their call: Yes

## 2018-11-21 NOTE — Telephone Encounter (Signed)
OK.  It looks like he has a URI and that is why he didn't go to the appointment, but he should have called and cancelled.

## 2018-12-05 NOTE — Telephone Encounter (Signed)
Lm have to resched     This is Keith Gallegos from Loanne Drilling, MD 's office calling to reschedule your upcoming appointment due to the recent COVID-19 virus, as a precautionary measure for your safety and for the safety of our community.     Are you having any urgent issues at this time?    IF yes:  Triage further if yes and offer telehealth visit -this is a normal office visit through insurance but we've waived the copay     Let patient know they will receive a phone call at the time of their visit.  Please confirm the phone number they want to be called at and make sure to stay by their phones to await call from provider.  You should change visit type to telehealth in Epic.     IF no:  Do you need any refills on your prescriptions?  (90 day if possible/preferred pharmacy)-- pend these and send encounter back to provider  Is there anything else that I can help you with at this time?   Please be aware that once your appointment is rescheduled or cancelled, you may still receive automated messages reminding you of your appointment. Please disregard those.  Please call the office for any questions or concerns. There is someone 24 hours a day at our call center to take your call.

## 2018-12-10 ENCOUNTER — Encounter: Attending: Family | Primary: Family Medicine

## 2019-01-04 NOTE — Progress Notes (Signed)
Previous message was sent to pt.

## 2019-04-22 ENCOUNTER — Telehealth: Admit: 2019-04-22 | Discharge: 2019-04-22 | Payer: BLUE CROSS/BLUE SHIELD | Attending: Family | Primary: Family Medicine

## 2019-04-22 DIAGNOSIS — R197 Diarrhea, unspecified: Secondary | ICD-10-CM

## 2019-04-22 MED ORDER — AMLODIPINE BESYLATE 10 MG PO TABS
10 MG | ORAL_TABLET | Freq: Every day | ORAL | 2 refills | Status: DC
Start: 2019-04-22 — End: 2019-10-10

## 2019-04-22 NOTE — Telephone Encounter (Signed)
Noted, keep appt

## 2019-04-22 NOTE — Patient Instructions (Addendum)
To ER now for IV fluids, stabilization  F/u after discharge for VV. Review BPs at that time.

## 2019-04-22 NOTE — Telephone Encounter (Signed)
Lm for patient to call back to complete the pre-charting for his vv at 2 pm

## 2019-04-22 NOTE — Progress Notes (Signed)
[facility]  [address], [address]  [place], [place] [id number]    Subjective:  Chief Complaint   Patient presents with   ??? Diarrhea     x 2 days   ??? Nausea & Vomiting     x2days   ??? Abdominal Pain     x 2 days     HPI  Patient presents today for VV. States he went into work on 8/15 AM and started having every 20 minute diarrhea. He was sent home. Started vomiting. The vomiting and diarrhea have persisted into yesterday and today. He vomited once today and has had 10 episodes of liquid diarrhea. He has lost close to 20 lb since symptoms started. He is trying sips of Gatorade and BRAT diet. He cannot keep water down; it seems to upset his stomach. He has intermittent abdominal cramping right before BM. Denies blood in stool or urine, fever. He isn't confident about urinating TID. Urine volume definitely decreased. BP today 116/99. Normally systolic is 150s.     He works in "a [employer]" and his boss told him it might be campylobacter.     Patient states he feels bad enough to go to ER for IVF. I agree with that as a next step. [facility] and follow up after discharge.    Requests RF Amlodipine while on call.    Review of Systems   Constitutional: Positive for unexpected weight change. Negative for fever.   Gastrointestinal: Positive for abdominal pain, diarrhea, nausea and vomiting. Negative for blood in stool and constipation.   Genitourinary: Positive for decreased urine volume. Negative for hematuria.       Social History     Socioeconomic History   ??? Marital status: Married     Spouse name: Not on file   ??? Number of children: Not on file   ??? Years of education: Not on file   ??? Highest education level: Not on file   Occupational History   ??? Not on file   Social Needs   ??? Financial resource strain: Not on file   ??? Food insecurity     Worry: Not on file     Inability: Not on file   ??? Transportation needs     Medical: Not on file     Non-medical: Not on file    Tobacco Use   ??? Smoking status: Current Every Day Smoker     Packs/day: 0.25     Types: Cigarettes   ??? Smokeless tobacco: Never Used   ??? Tobacco comment: pt slowly cutting down on usage   Substance and Sexual Activity   ??? Alcohol use: Yes     Comment: social/holidays   ??? Drug use: No   ??? Sexual activity: Not on file   Lifestyle   ??? Physical activity     Days per week: Not on file     Minutes per session: Not on file   ??? Stress: Not on file   Relationships   ??? Social Wellsite geologistconnections     Talks on phone: Not on file     Gets together: Not on file     Attends religious service: Not on file     Active member of club or organization: Not on file     Attends meetings of clubs or organizations: Not on file     Relationship status: Not on file   ??? Intimate partner violence  Fear of current or ex partner: Not on file     Emotionally abused: Not on file     Physically abused: Not on file     Forced sexual activity: Not on file   Other Topics Concern   ??? Not on file   Social History Narrative   ??? Not on file     Past Medical History:   Diagnosis Date   ??? Allergic rhinitis    ??? Anxiety    ??? Hyperlipidemia    ??? Hypertension      History reviewed. No pertinent surgical history.  Allergies   Allergen Reactions   ??? Lactose Intolerance (Gi)      Family History   Problem Relation Age of Onset   ??? No Known Problems Mother    ??? No Known Problems Father    ??? No Known Problems Sister    ??? No Known Problems Brother    ??? No Known Problems Maternal Grandmother    ??? No Known Problems Maternal Grandfather    ??? No Known Problems Paternal Grandmother    ??? No Known Problems Paternal Grandfather    ??? No Known Problems Daughter    ??? No Known Problems Son      Objective:   There were no vitals filed for this visit.  Current Outpatient Medications   Medication Sig Dispense Refill   ??? amLODIPine (NORVASC) 10 MG tablet Take 1 tablet by mouth daily 90 tablet 2   ??? escitalopram (LEXAPRO) 20 MG tablet Take 1 tablet by mouth daily 90 tablet 4   ???  losartan-hydrochlorothiazide (HYZAAR) 100-25 MG per tablet Take 1 tablet by mouth daily 90 tablet 4   ??? omeprazole (PRILOSEC) 20 MG delayed release capsule Take 1 capsule by mouth daily 90 capsule 4   ??? metoprolol tartrate (LOPRESSOR) 50 MG tablet Take 1 tablet by mouth 2 times daily 180 tablet 4   ??? buPROPion (WELLBUTRIN XL) 150 MG extended release tablet Take 1 tablet by mouth every morning 90 tablet 3   ??? cetirizine (ZYRTEC) 10 MG tablet Take 10 mg by mouth daily     ??? ibuprofen (ADVIL;MOTRIN) 200 MG tablet Take 200 mg by mouth every 6 hours as needed for Pain       No current facility-administered medications for this visit.      Physical Exam  Vitals signs and nursing note reviewed.   Pulmonary:      Effort: Pulmonary effort is normal.   Skin:     Coloration: Skin is pale.   Neurological:      Mental Status: He is alert.       Assessment:      Diagnosis Orders   1. Diarrhea, unspecified type     2. Non-intractable vomiting with nausea, unspecified vomiting type     3. Dehydration     4. Hypertension, unspecified type  amLODIPine (NORVASC) 10 MG tablet     Plan:   No orders of the defined types were placed in this encounter.    Orders Placed This Encounter   Medications   ??? amLODIPine (NORVASC) 10 MG tablet     Sig: Take 1 tablet by mouth daily     Dispense:  90 tablet     Refill:  2     Patient Instructions   To ER now for IV fluids, stabilization  F/u after discharge for VV. Review BPs at that time.        Patient was seen today  via Telehealth by agreement and consent in light of the current COVID-19 pandemic. I used the following Telehealth technology: Audio and video capabilities. This patient encounter is appropriate and reasonable under the circumstances given the patient's particular presentation at this time. The patient has been advised of the potential risks and limitations of this mode of treatment (including but not limited to the absence of in-person examination) and has agreed to be treated in a  remote fashion in spite of them. Any and all of the patient's/patient's family's questions on this issue have been answered and I have made no promises or guarantees to the patient. The patient has also been advised to contact this office for worsening conditions or problems, and seek emergency medical treatment and/or call 911 if the patient deems either necessary.

## 2019-04-22 NOTE — Telephone Encounter (Signed)
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 Pt states was told by co workers- campylobacter infection.  R: Office contacted  For scheduling advice. Pt scheduled for a virtual visit today at 1400 with  [facility]. Allergies and pharmacy verified. Home care instructions reviewed. Pt states new insurance ([employer]) . Pt instructed office will contact him prior to visit to review process. Pt asked to have new insurance information and current medications available for review. Pt verbalized understanding.        Reason for Disposition  ??? SEVERE diarrhea (e.g., 7 or more times / day more than normal) and present > 24 hours (1 day)    Protocols used: DIARRHEA-ADULT-[place]

## 2019-04-24 MED ORDER — GENERIC EXTERNAL MEDICATION
Status: DC
Start: 2019-04-24 — End: 2019-04-24

## 2019-04-24 MED ORDER — METOPROLOL TARTRATE 50 MG PO TABS
50 | ORAL | Status: DC
Start: 2019-04-24 — End: 2019-04-24

## 2019-04-24 MED ORDER — GENERIC EXTERNAL MEDICATION
Status: DC | PRN
Start: 2019-04-24 — End: 2019-04-24

## 2019-04-24 MED ORDER — BUPROPION HCL ER (XL) 150 MG PO TB24
150 | ORAL | Status: DC
Start: 2019-04-24 — End: 2019-04-24

## 2019-04-24 MED ORDER — AMLODIPINE BESYLATE 5 MG PO TABS
5 | ORAL | Status: DC
Start: 2019-04-24 — End: 2019-04-24

## 2019-04-24 MED ORDER — SODIUM CHLORIDE 0.9 % IV SOLN
0.9 | INTRAVENOUS | Status: DC
Start: 2019-04-24 — End: 2019-04-24

## 2019-04-24 MED ORDER — ONDANSETRON HCL 4 MG/2ML IJ SOLN
4 | INTRAMUSCULAR | Status: DC | PRN
Start: 2019-04-24 — End: 2019-04-24

## 2019-04-24 MED ORDER — ESCITALOPRAM OXALATE 20 MG PO TABS
20 | ORAL | Status: DC
Start: 2019-04-24 — End: 2019-04-24

## 2019-04-24 MED ORDER — PANTOPRAZOLE SODIUM 40 MG PO TBEC
40 | ORAL | Status: DC
Start: 2019-04-24 — End: 2019-04-24

## 2019-04-25 ENCOUNTER — Telehealth
Admit: 2019-04-25 | Discharge: 2019-05-15 | Payer: PRIVATE HEALTH INSURANCE | Attending: Family | Primary: Family Medicine

## 2019-04-25 DIAGNOSIS — R7989 Other specified abnormal findings of blood chemistry: Secondary | ICD-10-CM

## 2019-04-25 NOTE — Telephone Encounter (Signed)
Name of Caller: Wendelin Bradt phone number: [phone]    Relationship to Patient: pt    Provider: Fredrich Romans    Practice:  [facility]    Chief Complaint/Reason for Call: patient has a video appt for 3:00 pm today. Please advise    Best time of day caller can be reached: any       Patient advised that office/PCP has 24-48 business hours to return their call: no

## 2019-04-25 NOTE — Telephone Encounter (Signed)
Pre charting complete

## 2019-04-25 NOTE — Progress Notes (Signed)
Subjective:      Patient ID: Keith Gallegos is a 38 y.o. male.    HPI   Seen 04/22/19 by NP Jadene Pierini for diarrhea, vomiting, and dehydration  Sent to ER; dx with acute kidney injury, diarrhea, hypokalemia, htn, anxiety, and dehydration  Pt went to ER 04/22/19 and admitted for obs x2d  Got IV fluids  Kidney function improved. gfr was 27 but went back to >60  Potassium was still low (2.7-3.3 in ER)  Cbc sl low hgb and hct  Mg normal  Stool cx was neg  covid-19 test was neg  Diarrhea less frequent  D/c was 04/24/19; told to take potassium 8/20-8/22  Not taking losartan/hctz right now    Soft stool now; only 1 stool today; no bld in stool; no nausea; last vomit was 2d ago; no abd pain; no HA    No cp, heart racing, sob, wheezing, calf pain, or fever  No dizziness, no vision change    Bps 168/85 with pulse 80  On metoprolol 50mg  1 bid, amlodipine 10mg  1 a day    Review of Systems  Heart neg  resp neg  Gi neg except as above    Objective:   Physical Exam  General alert and oriented  Does not look toxic    Assessment:       Diagnosis Orders   1. Abnormal CBC  CBC Auto Differential   2. Hypokalemia  Basic Metabolic Panel   3. Decreased renal function  Basic Metabolic Panel   4. Diarrhea, unspecified type      resolving   5. Dehydration      resolved           Plan:      Patient Instructions   Restart losartan/hctz  Take the 3 days of the potassium  Recheck labs on Monday at [facility] -they are open til 5p  check Bp once a day; <140/<90 goal  Send me a mychart message on Monday with bps and how you are feeling  Call if bp>180/>100 or cp or heart racing or sob or wheezing or if restart diarrhea or vomiting or fever or dizziness or severe HA  Push fluids      Lives in [place]  Works in [place], APRN - CNP

## 2019-04-25 NOTE — Patient Instructions (Addendum)
Restart losartan/hctz  Take the 3 days of the potassium  Rec[facility] labs on Monday at [facility] -they are open til 5p  check Bp once a day; <140/<90 goal  Send me a mychart message on Monday with bps and how you are feeling  Call if bp>180/>100 or cp or heart racing or sob or wheezing or if restart diarrhea or vomiting or fever or dizziness or severe HA  Push fluids      Lives in [place]  Works in [employer] til [employer]

## 2019-04-29 NOTE — Telephone Encounter (Signed)
Name of Caller: Keith Gallegos phone number: [phone]    Relationship to Patient: patient    Provider: Jean Rosenthal     Practice:  [facility]     Chief Complaint/Reason for Call: Pt states that he was supposed to have bloodwork orders sent over to [facility] for routine blood work (Pt had low potassium when seen in hospital last week) Patient states hospital does not have the orders and would like them sent to this location. Please advise.     Best time of day caller can be reached: Any      Patient advised that office/PCP has 24-48 business hours to return their call: Yes

## 2019-04-30 NOTE — Telephone Encounter (Signed)
Lab orders faxed per pt request

## 2019-05-08 LAB — CBC WITH DIFFERENTIAL
Hematocrit: 41.6 % (ref 41–53)
Hemoglobin: 13.5 g/dL (ref 13.5–17.5)
Platelets: 327 K/??L
WBC: 5.8 10^3/mL

## 2019-05-08 LAB — BASIC METABOLIC PANEL
BUN: 7 mg/dL — ABNORMAL LOW
CO2: 28 mmol/L
Calcium: 9.6 mg/dL
Chloride: 104 mmol/L
Creatinine: 0.92
Gfr Calculated: >60
Glucose: 95 mg/dL
Potassium: 4 mmol/L
Sodium: 143 mmol/L

## 2019-05-09 ENCOUNTER — Encounter

## 2019-05-09 MED ORDER — METOPROLOL TARTRATE 100 MG PO TABS
100 MG | ORAL_TABLET | Freq: Two times a day (BID) | ORAL | 4 refills | Status: DC
Start: 2019-05-09 — End: 2019-11-04

## 2019-05-09 NOTE — Telephone Encounter (Signed)
Sounds like you need something to replace the losartan to get the BP down.  Increase the metoprolol to 100 mg twice daily and monitor the readings.  A new script was sent to the pharmacy

## 2019-05-09 NOTE — Telephone Encounter (Signed)
Attempted to contact patient regarding message below. No answer and no voicemail box has been set up yet.

## 2019-05-09 NOTE — Telephone Encounter (Signed)
S-Pt was on lorsartan went to hospital and potassium was low so he was taken  off losartan. BP is reading about 165/100  To 180/100-- then 150/70 in evening -  B-states he is still having some mild low back pain, Had repeat labs done for CMP and CBC  A-states BP is elevated and is drinking increased fluids, Pt also states he drinks a fifth of alcohol every 2 days. Denis any fever, shortness of breath.  R-Attempted to schedule Hospital Follow up visit and no 30 min times for Dr Jean Rosenthal. Please call patient @ [phone]    Reason for Disposition  ??? Systolic BP >= 160 OR Diastolic >= 100    Protocols used: HIGH BLOOD PRESSURE-ADULT-[place]

## 2019-05-09 NOTE — Telephone Encounter (Signed)
S: Patient called the clinical access center blood pressure question    B: [facility] RN returning the call    A: No contact.    R: Left VM for patient to call back if assistance is still needed.   Patient instructed to call back with worsening symptoms, concerns or questions.     Reason for Disposition  ??? Message left on identified voicemail    Protocols used: NO CONTACT OR DUPLICATE CONTACT CALL-ADULT-[place]

## 2019-05-10 NOTE — Telephone Encounter (Signed)
Patient aware.

## 2019-05-28 ENCOUNTER — Ambulatory Visit
Admit: 2019-05-28 | Discharge: 2019-05-28 | Payer: PRIVATE HEALTH INSURANCE | Attending: Physician Assistant | Primary: Family Medicine

## 2019-05-28 DIAGNOSIS — H1031 Unspecified acute conjunctivitis, right eye: Secondary | ICD-10-CM

## 2019-05-28 MED ORDER — CIPROFLOXACIN HCL 0.3 % OP SOLN
0.3 % | OPHTHALMIC | 1 refills | Status: AC
Start: 2019-05-28 — End: 2019-06-07

## 2019-05-28 NOTE — Telephone Encounter (Signed)
Last cpe on :   10/19/2017.    Acute appt with Keith Gallegos today for eye inf.    Pt mentioned Alcoholism and requested help for withdrawal sx.    FYI- advised message would be routed to pcp and scheduling.

## 2019-05-28 NOTE — Progress Notes (Signed)
[facility]   [address]  [place], [place] [address]      SUBJECTIVE:      Keith Gallegos   38 y.o.   male     Chief Complaint   Patient presents with   ??? Eye Pain     right eye internal, redness, edema external, clear discharge x 2 days. covid screen done.       HPI: Conjunctivitis    The current episode started 2 days ago. The onset was sudden. The problem has been gradually worsening. The problem is moderate. Nothing relieves the symptoms. Nothing aggravates the symptoms. Associated symptoms include eye itching, eye discharge and eye redness. Pertinent negatives include no fever, no decreased vision, no photophobia, no congestion, no sore throat, no swollen glands and no eye pain. The right eye is affected. The eye pain is not associated with movement. The eyelid exhibits no abnormality.      BP elevated today. Pt states he did not know he was supposed to restart his losartan-HCTZ (as stated by Dionicia Abler at [facility] visit). States his BP has been running high at home as well. Pt does not need a refill, "has plenty at home".       Review of Systems   Constitutional: Negative for fever.   HENT: Negative for congestion and sore throat.    Eyes: Positive for discharge, redness and itching. Negative for photophobia and pain.   Respiratory: Negative for chest tightness and shortness of breath.    Cardiovascular: Negative for chest pain.        Past Medical History:   Diagnosis Date   ??? Allergic rhinitis    ??? Anxiety    ??? Hyperlipidemia    ??? Hypertension         History reviewed. No pertinent surgical history.    Social History     Tobacco Use   ??? Smoking status: Former Smoker     Packs/day: 0.25     Types: Cigarettes   ??? Smokeless tobacco: Never Used   ??? Tobacco comment: pt slowly cutting down on usage   Substance Use Topics   ??? Alcohol use: Yes     Alcohol/week: 7.0 standard drinks     Types: 7 Cans of beer per week     Frequency: 4 or more times a week     Drinks per session: 5 or 6     Binge  frequency: Patient refused     Comment: social/holidays       Family History   Problem Relation Age of Onset   ??? No Known Problems Mother    ??? No Known Problems Father    ??? No Known Problems Sister    ??? No Known Problems Brother    ??? No Known Problems Maternal Grandmother    ??? No Known Problems Maternal Grandfather    ??? No Known Problems Paternal Grandmother    ??? No Known Problems Paternal Grandfather    ??? No Known Problems Daughter    ??? No Known Problems Son        Current Outpatient Medications   Medication Sig Dispense Refill   ??? ciprofloxacin (CILOXAN) 0.3 % ophthalmic solution Place 1 drop into both eyes every 2 hours for 10 days 120 drop 1   ??? metoprolol tartrate (LOPRESSOR) 100 MG tablet Take 1 tablet by mouth 2 times daily 60 tablet 4   ??? Probiotic Product (PROBIOTIC DAILY PO) Take by mouth     ???  amLODIPine (NORVASC) 10 MG tablet Take 1 tablet by mouth daily 90 tablet 2   ??? escitalopram (LEXAPRO) 20 MG tablet Take 1 tablet by mouth daily 90 tablet 4   ??? omeprazole (PRILOSEC) 20 MG delayed release capsule Take 1 capsule by mouth daily 90 capsule 4   ??? buPROPion (WELLBUTRIN XL) 150 MG extended release tablet Take 1 tablet by mouth every morning 90 tablet 3   ??? cetirizine (ZYRTEC) 10 MG tablet Take 10 mg by mouth daily     ??? ibuprofen (ADVIL;MOTRIN) 200 MG tablet Take 200 mg by mouth every 6 hours as needed for Pain     ??? potassium chloride (KLOR-CON M) 20 MEQ extended release tablet Take 20 mEq by mouth daily     ??? loperamide (IMODIUM) 2 MG capsule Take 2 mg by mouth 4 times daily as needed       No current facility-administered medications for this visit.        Immunization History   Administered Date(s) Administered   ??? Influenza, Quadv, 6 mo and older, IM, PF (Flulaval, Fluarix) 10/19/2017   ??? Pneumococcal Polysaccharide (Pneumovax23) 10/19/2017   ??? Tdap (Boostrix, Adacel) 10/19/2017       OBJECTIVE:      BP (!) 150/110 (Site: Right Upper Arm, Position: Sitting, Cuff Size: Large Adult) Comment: manual bp    Pulse 71    Temp 97.5 ??F (36.4 ??C) (Infrared)    Wt (!) 340 lb 3.2 oz (154.3 kg)    SpO2 96%    BMI 46.14 kg/m??     Physical Exam  Vitals signs and nursing note reviewed.   Constitutional:       Appearance: He is well-developed.   HENT:      Right Ear: Tympanic membrane and ear canal normal.      Left Ear: Tympanic membrane and ear canal normal.      Nose: Nose normal.      Right Sinus: No maxillary sinus tenderness or frontal sinus tenderness.      Left Sinus: No maxillary sinus tenderness or frontal sinus tenderness.      Mouth/Throat:      Pharynx: No oropharyngeal exudate.   Eyes:      General: Lids are normal.         Right eye: No discharge.         Left eye: No discharge.      Extraocular Movements: Extraocular movements intact.      Conjunctiva/sclera:      Right eye: Right conjunctiva is injected. Exudate present.      Pupils: Pupils are equal, round, and reactive to light.   Neck:      Musculoskeletal: Normal range of motion and neck supple.   Cardiovascular:      Rate and Rhythm: Normal rate and regular rhythm.      Heart sounds: Normal heart sounds.   Pulmonary:      Effort: Pulmonary effort is normal.      Breath sounds: Normal breath sounds.   Lymphadenopathy:      Cervical: No cervical adenopathy.         ASSESSMENT/PLAN:        1. Acute bacterial conjunctivitis of right eye  - Avoid touching the eye.  - Warm compress to help with discharge and swelling.  - ciprofloxacin (CILOXAN) 0.3 % ophthalmic solution; Place 1 drop into both eyes every 2 hours for 10 days  Dispense: 120 drop; Refill: 1    2. Essential  hypertension  - Restart losartan-HCTZ.  - Log BP on MyChart.  - BMP to check potassium in 1 month.  - Basic Metabolic Panel; Future  - MyChart Blood Pressure Flowsheet       There are no Patient Instructions on file for this visit.     Return if symptoms worsen or fail to improve.       Lenise Arena, MS PA-C

## 2019-05-28 NOTE — Telephone Encounter (Signed)
NOTED. Scheduled

## 2019-06-05 ENCOUNTER — Ambulatory Visit
Admit: 2019-06-05 | Discharge: 2019-06-05 | Payer: PRIVATE HEALTH INSURANCE | Attending: Family Medicine | Primary: Family Medicine

## 2019-06-05 DIAGNOSIS — F1010 Alcohol abuse, uncomplicated: Secondary | ICD-10-CM

## 2019-06-05 MED ORDER — BUSPIRONE HCL 5 MG PO TABS
5 MG | ORAL_TABLET | Freq: Two times a day (BID) | ORAL | 1 refills | Status: AC
Start: 2019-06-05 — End: 2019-07-05

## 2019-06-05 MED ORDER — NALTREXONE HCL 50 MG PO TABS
50 | ORAL_TABLET | Freq: Every day | ORAL | 1 refills | Status: AC
Start: 2019-06-05 — End: 2019-07-05

## 2019-06-05 NOTE — Progress Notes (Signed)
Subjective:    Chief Complaint   Patient presents with   . ED Follow-up     Patient here today for ER follow-up appointment.  He was seen at [facility] ER few days ago for chest pain.  No diagnosis given.  He wishes to discuss his alcohol consumption and possible solutions.       Drinks a lot.  Had been with drawing.  Never goes more than a couple days without drinking.  Initially drinks because he liked it.  Has never tried to quit.  Has gone to counseling before to help.  Went to [facility], but that was a group setting, which made him uncomfortable.  Craves it if he goes too long in between.  Rum and vodka.  Likes the liquor.  He goes and buys it.  Has some trouble sleeping when he is sober.  Can finish a fifth of rum in a day.  No DUI's, but it has affected his work.  Chart reviewed.         Review of Systems   Constitutional: Negative for activity change and appetite change.   Gastrointestinal: Negative for abdominal pain.        Liver enzymes are a little elevated.   Psychiatric/Behavioral: Positive for agitation and sleep disturbance. The patient is nervous/anxious.            Social History     Socioeconomic History   . Marital status: Married     Spouse name: Not on file   . Number of children: Not on file   . Years of education: Not on file   . Highest education level: Not on file   Occupational History   . Not on file   Social Needs   . Financial resource strain: Not hard at all   . Food insecurity     Worry: Never true     Inability: Never true   . Transportation needs     Medical: No     Non-medical: No   Tobacco Use   . Smoking status: Former Smoker     Packs/day: 1.50     Years: 15.00     Pack years: 22.50     Types: Cigarettes     Last attempt to quit: 12/03/2018     Years since quitting: 0.5   . Smokeless tobacco: Never Used   Substance and Sexual Activity   . Alcohol use: Yes     Frequency: 4 or more times a week     Drinks per session: 5 or 6     Binge frequency: Patient refused   . Drug use: No   .  Sexual activity: Yes     Partners: Female   Lifestyle   . Physical activity     Days per week: 0 days     Minutes per session: 0 min   . Stress: Not at all   Relationships   . Social Wellsite geologist on phone: Three times a week     Gets together: Twice a week     Attends religious service: Never     Active member of club or organization: No     Attends meetings of clubs or organizations: Never     Relationship status: Never married   . Intimate partner violence     Fear of current or ex partner: Patient refused     Emotionally abused: Patient refused     Physically abused: Patient refused  Forced sexual activity: Patient refused   Other Topics Concern   . Not on file   Social History Narrative   . Not on file     Past Medical History:   Diagnosis Date   . Allergic rhinitis    . Anxiety    . Hyperlipidemia    . Hypertension      History reviewed. No pertinent surgical history.  Allergies   Allergen Reactions   . Lactose Intolerance (Gi) Diarrhea     Family History   Problem Relation Age of Onset   . No Known Problems Mother    . No Known Problems Father    . No Known Problems Sister    . No Known Problems Brother    . No Known Problems Maternal Grandmother    . No Known Problems Maternal Grandfather    . No Known Problems Paternal Grandmother    . No Known Problems Paternal Grandfather    . No Known Problems Daughter    . No Known Problems Son        Objective:       Vitals:    06/05/19 1101   BP: 115/74   Site: Left Upper Arm   Position: Sitting   Cuff Size: Large Adult   Pulse: 79   Temp: 96.6 F (35.9 C)   TempSrc: Infrared   SpO2: 97%   Weight: (!) 338 lb (153.3 kg)   Height: 5\' 10"  (1.778 m)           Current Outpatient Medications   Medication Sig Dispense Refill   . naltrexone (DEPADE) 50 MG tablet Take 1 tablet by mouth daily 30 tablet 1   . busPIRone (BUSPAR) 5 MG tablet Take 1 tablet by mouth 2 times daily 60 tablet 1   . ciprofloxacin (CILOXAN) 0.3 % ophthalmic solution Place 1 drop into both  eyes every 2 hours for 10 days 120 drop 1   . metoprolol tartrate (LOPRESSOR) 100 MG tablet Take 1 tablet by mouth 2 times daily 60 tablet 4   . potassium chloride (KLOR-CON M) 20 MEQ extended release tablet Take 20 mEq by mouth daily     . loperamide (IMODIUM) 2 MG capsule Take 2 mg by mouth 4 times daily as needed     . Probiotic Product (PROBIOTIC DAILY PO) Take by mouth     . amLODIPine (NORVASC) 10 MG tablet Take 1 tablet by mouth daily 90 tablet 2   . escitalopram (LEXAPRO) 20 MG tablet Take 1 tablet by mouth daily 90 tablet 4   . omeprazole (PRILOSEC) 20 MG delayed release capsule Take 1 capsule by mouth daily 90 capsule 4   . buPROPion (WELLBUTRIN XL) 150 MG extended release tablet Take 1 tablet by mouth every morning 90 tablet 3   . cetirizine (ZYRTEC) 10 MG tablet Take 10 mg by mouth daily     . ibuprofen (ADVIL;MOTRIN) 200 MG tablet Take 200 mg by mouth every 6 hours as needed for Pain       No current facility-administered medications for this visit.        Physical Exam  Vitals signs and nursing note reviewed.   Constitutional:       General: He is not in acute distress.     Appearance: He is obese. He is not ill-appearing or toxic-appearing.   Eyes:      General: No scleral icterus.     Conjunctiva/sclera: Conjunctivae normal.      Pupils: Pupils  are equal, round, and reactive to light.   Neck:      Musculoskeletal: Neck supple.   Cardiovascular:      Rate and Rhythm: Normal rate and regular rhythm.      Heart sounds: Normal heart sounds. No murmur.   Pulmonary:      Effort: Pulmonary effort is normal. No respiratory distress.      Breath sounds: Normal breath sounds.   Abdominal:      Tenderness: There is abdominal tenderness. There is no right CVA tenderness, left CVA tenderness, guarding or rebound.      Comments: The liver edge is not enlarged, but it is a little tender.   Lymphadenopathy:      Cervical: No cervical adenopathy.   Neurological:      Mental Status: He is alert.           Assessment:          Diagnosis Orders   1. Alcohol abuse  naltrexone (DEPADE) 50 MG tablet    busPIRone (BUSPAR) 5 MG tablet   2. Essential hypertension     3. Class 3 severe obesity with serious comorbidity and body mass index (BMI) of 45.0 to 49.9 in adult, unspecified obesity type (HCC)           Plan:     Goals     . Patient Stated (pt-stated)      Decrease BP    Barriers: diet  Plan for overcoming my barriers: improve diet, cooking fresh foods, lose weight  Confidence: 4/10  Anticipated Goal Completion Date: 04/2018              Loanne DrillingGlenna S Rhemi Balbach, MD      He has an appointment already on 06/27/2019.

## 2019-06-27 ENCOUNTER — Telehealth
Admit: 2019-06-27 | Discharge: 2019-06-27 | Payer: PRIVATE HEALTH INSURANCE | Attending: Family Medicine | Primary: Family Medicine

## 2019-06-27 DIAGNOSIS — I10 Essential (primary) hypertension: Secondary | ICD-10-CM

## 2019-06-27 NOTE — Telephone Encounter (Signed)
Attempted to contact patient for pre-visit charting prior to today's virtual visit with Dr. Jean Rosenthal, but could only leave voicemail that I will try again later.

## 2019-06-27 NOTE — Telephone Encounter (Signed)
Spoke to patient, pre-visit charting done.

## 2019-06-27 NOTE — Progress Notes (Signed)
Subjective:    Chief Complaint   Patient presents with   . 1 Month Follow-Up     Patient is in need of follow-up appointment for visit from 06/05/2019.  He states his blood pressure readings have been normal and will take a reading at time visit.  No refills needed today.       Patient was seen today via Telehealth by agreement and consent in light of the current COVID-19 pandemic. I used the following Telehealth technology: Audio and video capabilities. This patient encounter is appropriate and reasonable under the circumstances given the patient's particular presentation at this time. The patient has been advised of the potential risks and limitations of this mode of treatment (including but not limited to the absence of in-person examination) and has agreed to be treated in a remote fashion in spite of them. Any and all of the patient's/patient's family's questions on this issue have been answered and I have made no promises or guarantees to the patient. The patient has also been advised to contact this office for worsening conditions or problems, and seek emergency medical treatment and/or call 911 if the patient deems either necessary.  Blood pressure has been good.  No alcohol since I last saw him.  He is tolerating all the medicines.  NO side effects, although he does notice that he has lost weight and has a decreaed appetite.  Mild issues falling asleep.  Chart reviewed.         Review of Systems   Constitutional: Positive for activity change, appetite change and unexpected weight change.   Respiratory: Negative for chest tightness, shortness of breath and wheezing.    Gastrointestinal: Negative for diarrhea.   Psychiatric/Behavioral: Positive for sleep disturbance. Negative for agitation.        He feels his mood is better.           Social History     Socioeconomic History   . Marital status: Married     Spouse name: Not on file   . Number of children: Not on file   . Years of education: Not on file   .  Highest education level: Not on file   Occupational History   . Not on file   Social Needs   . Financial resource strain: Not hard at all   . Food insecurity     Worry: Never true     Inability: Never true   . Transportation needs     Medical: No     Non-medical: No   Tobacco Use   . Smoking status: Former Smoker     Packs/day: 1.50     Years: 15.00     Pack years: 22.50     Types: Cigarettes     Last attempt to quit: 12/03/2018     Years since quitting: 0.5   . Smokeless tobacco: Never Used   Substance and Sexual Activity   . Alcohol use: Yes     Frequency: 4 or more times a week     Drinks per session: 5 or 6     Binge frequency: Patient refused   . Drug use: No   . Sexual activity: Yes     Partners: Female   Lifestyle   . Physical activity     Days per week: 0 days     Minutes per session: 0 min   . Stress: Not at all   Relationships   . Social Product manager on  phone: Three times a week     Gets together: Twice a week     Attends religious service: Never     Active member of club or organization: No     Attends meetings of clubs or organizations: Never     Relationship status: Never married   . Intimate partner violence     Fear of current or ex partner: Patient refused     Emotionally abused: Patient refused     Physically abused: Patient refused     Forced sexual activity: Patient refused   Other Topics Concern   . Not on file   Social History Narrative   . Not on file     Past Medical History:   Diagnosis Date   . Allergic rhinitis    . Anxiety    . Hyperlipidemia    . Hypertension      History reviewed. No pertinent surgical history.  Allergies   Allergen Reactions   . Lactose Intolerance (Gi) Diarrhea     Family History   Problem Relation Age of Onset   . No Known Problems Mother    . No Known Problems Father    . No Known Problems Sister    . No Known Problems Brother    . No Known Problems Maternal Grandmother    . No Known Problems Maternal Grandfather    . No Known Problems Paternal Grandmother     . No Known Problems Paternal Grandfather    . No Known Problems Daughter    . No Known Problems Son        Objective:       There were no vitals filed for this visit.        Current Outpatient Medications   Medication Sig Dispense Refill   . naltrexone (DEPADE) 50 MG tablet Take 1 tablet by mouth daily 30 tablet 1   . busPIRone (BUSPAR) 5 MG tablet Take 1 tablet by mouth 2 times daily 60 tablet 1   . metoprolol tartrate (LOPRESSOR) 100 MG tablet Take 1 tablet by mouth 2 times daily 60 tablet 4   . potassium chloride (KLOR-CON M) 20 MEQ extended release tablet Take 20 mEq by mouth daily     . loperamide (IMODIUM) 2 MG capsule Take 2 mg by mouth 4 times daily as needed     . Probiotic Product (PROBIOTIC DAILY PO) Take by mouth     . amLODIPine (NORVASC) 10 MG tablet Take 1 tablet by mouth daily 90 tablet 2   . escitalopram (LEXAPRO) 20 MG tablet Take 1 tablet by mouth daily 90 tablet 4   . omeprazole (PRILOSEC) 20 MG delayed release capsule Take 1 capsule by mouth daily 90 capsule 4   . buPROPion (WELLBUTRIN XL) 150 MG extended release tablet Take 1 tablet by mouth every morning 90 tablet 3   . cetirizine (ZYRTEC) 10 MG tablet Take 10 mg by mouth daily     . ibuprofen (ADVIL;MOTRIN) 200 MG tablet Take 200 mg by mouth every 6 hours as needed for Pain       No current facility-administered medications for this visit.        Physical Exam  Vitals signs reviewed.   Constitutional:       General: He is not in acute distress.     Appearance: He is obese. He is not ill-appearing or toxic-appearing.      Comments: Due to the current efforts to prevent transmission of COVID-19 and also  the need to preserve PPE for other caregivers, a face-to-face encounter with the patient was not performed. That being said, all relevant records and diagnostic tests were reviewed, including laboratory results and imaging. Please reference any relevant documentation elsewhere. Care will be coordinated with the primary service.     Eyes:       General: No scleral icterus.  Neurological:      Mental Status: He is alert.   Psychiatric:         Mood and Affect: Mood normal.         Behavior: Behavior normal.         Thought Content: Thought content normal.           Assessment:         Diagnosis Orders   1. Essential hypertension     2. Alcohol abuse       We agreed that he would call the office and schedule a follow up visit in 4 weeks.    Plan:     Goals     . Patient Stated (pt-stated)      Decrease BP    Barriers: diet  Plan for overcoming my barriers: improve diet, cooking fresh foods, lose weight  Confidence: 4/10  Anticipated Goal Completion Date: 04/2018              Loanne Drilling, MD

## 2019-09-15 ENCOUNTER — Emergency Department (HOSPITAL_COMMUNITY): Payer: Medicaid - Out of State

## 2019-09-15 ENCOUNTER — Emergency Department (HOSPITAL_COMMUNITY)
Admission: EM | Admit: 2019-09-15 | Discharge: 2019-09-15 | Disposition: A | Discharge status: Home / Self Care | Payer: Medicaid - Out of State | Attending: Emergency Medicine | Admitting: Emergency Medicine

## 2019-09-15 ENCOUNTER — Encounter (HOSPITAL_COMMUNITY): Payer: Self-pay

## 2019-09-15 ENCOUNTER — Other Ambulatory Visit: Payer: Self-pay

## 2019-09-15 DIAGNOSIS — F17210 Nicotine dependence, cigarettes, uncomplicated: Secondary | ICD-10-CM | POA: Diagnosis not present

## 2019-09-15 DIAGNOSIS — I10 Essential (primary) hypertension: Secondary | ICD-10-CM | POA: Diagnosis not present

## 2019-09-15 DIAGNOSIS — F419 Anxiety disorder, unspecified: Secondary | ICD-10-CM | POA: Insufficient documentation

## 2019-09-15 DIAGNOSIS — Z79899 Other long term (current) drug therapy: Secondary | ICD-10-CM | POA: Diagnosis not present

## 2019-09-15 DIAGNOSIS — R112 Nausea with vomiting, unspecified: Secondary | ICD-10-CM | POA: Diagnosis present

## 2019-09-15 DIAGNOSIS — Z20822 Contact with and (suspected) exposure to covid-19: Secondary | ICD-10-CM | POA: Diagnosis not present

## 2019-09-15 HISTORY — DX: Anxiety disorder, unspecified: F41.9

## 2019-09-15 HISTORY — DX: Essential (primary) hypertension: I10

## 2019-09-15 HISTORY — DX: Pure hypercholesterolemia, unspecified: E78.00

## 2019-09-15 HISTORY — DX: Fatty (change of) liver, not elsewhere classified: K76.0

## 2019-09-15 LAB — CBC WITH DIFFERENTIAL/PLATELET
Abs Immature Granulocytes: 0.05 10*3/uL (ref 0.00–0.07)
Basophils Absolute: 0.1 10*3/uL (ref 0.0–0.1)
Basophils Relative: 1 %
Eosinophils Absolute: 0.2 10*3/uL (ref 0.0–0.5)
Eosinophils Relative: 2 %
HCT: 47.5 % (ref 39.0–52.0)
Hemoglobin: 15.7 g/dL (ref 13.0–17.0)
Immature Granulocytes: 0 %
Lymphocytes Relative: 15 %
Lymphs Abs: 1.9 10*3/uL (ref 0.7–4.0)
MCH: 32 pg (ref 26.0–34.0)
MCHC: 33.1 g/dL (ref 30.0–36.0)
MCV: 96.9 fL (ref 80.0–100.0)
Monocytes Absolute: 1 10*3/uL (ref 0.1–1.0)
Monocytes Relative: 8 %
Neutro Abs: 9.3 10*3/uL — ABNORMAL HIGH (ref 1.7–7.7)
Neutrophils Relative %: 74 %
Platelets: 248 10*3/uL (ref 150–400)
RBC: 4.9 MIL/uL (ref 4.22–5.81)
RDW: 14.4 % (ref 11.5–15.5)
WBC: 12.6 10*3/uL — ABNORMAL HIGH (ref 4.0–10.5)
nRBC: 0 % (ref 0.0–0.2)

## 2019-09-15 LAB — URINALYSIS, ROUTINE W REFLEX MICROSCOPIC
Bacteria, UA: NONE SEEN
Bilirubin Urine: NEGATIVE
Glucose, UA: NEGATIVE mg/dL
Hgb urine dipstick: NEGATIVE
Ketones, ur: NEGATIVE mg/dL
Nitrite: NEGATIVE
Protein, ur: NEGATIVE mg/dL
Specific Gravity, Urine: 1.017 (ref 1.005–1.030)
pH: 6 (ref 5.0–8.0)

## 2019-09-15 LAB — COMPREHENSIVE METABOLIC PANEL
ALT: 61 U/L — ABNORMAL HIGH (ref 0–44)
AST: 47 U/L — ABNORMAL HIGH (ref 15–41)
Albumin: 4.4 g/dL (ref 3.5–5.0)
Alkaline Phosphatase: 97 U/L (ref 38–126)
Anion gap: 13 (ref 5–15)
BUN: 12 mg/dL (ref 6–20)
CO2: 24 mmol/L (ref 22–32)
Calcium: 9.5 mg/dL (ref 8.9–10.3)
Chloride: 100 mmol/L (ref 98–111)
Creatinine, Ser: 0.89 mg/dL (ref 0.61–1.24)
GFR calc Af Amer: >60 mL/min (ref >60)
GFR calc non Af Amer: >60 mL/min (ref >60)
Glucose, Bld: 99 mg/dL (ref 70–99)
Potassium: 3.9 mmol/L (ref 3.5–5.1)
Sodium: 137 mmol/L (ref 135–145)
Total Bilirubin: 0.6 mg/dL (ref 0.3–1.2)
Total Protein: 8 g/dL (ref 6.5–8.1)

## 2019-09-15 LAB — RAPID URINE DRUG SCREEN, HOSP PERFORMED
Amphetamines: NOT DETECTED
Barbiturates: NOT DETECTED
Benzodiazepines: NOT DETECTED
Cocaine: NOT DETECTED
Opiates: NOT DETECTED
Tetrahydrocannabinol: NOT DETECTED

## 2019-09-15 MED ORDER — SODIUM CHLORIDE 0.9 % IV BOLUS (SEPSIS)
1000.0000 mL | Freq: Once | INTRAVENOUS | Status: AC
  Administered 2019-09-15: 19:00:00 1000 mL via INTRAVENOUS

## 2019-09-15 MED ORDER — ONDANSETRON HCL 4 MG/2ML IJ SOLN
4.0000 mg | Freq: Once | INTRAMUSCULAR | Status: AC
  Administered 2019-09-15: 4 mg via INTRAVENOUS
  Filled 2019-09-15: qty 2

## 2019-09-15 MED ORDER — LORAZEPAM 1 MG PO TABS
1.0000 mg | ORAL_TABLET | Freq: Once | ORAL | Status: AC
  Administered 2019-09-15: 1 mg via ORAL
  Filled 2019-09-15: qty 1

## 2019-09-15 NOTE — ED Provider Notes (Signed)
[facility]-EMERGENCY DEPT Provider Note   CSN: [id number] Arrival date & time: 09/15/19  1749     History Chief Complaint  Patient presents with  . Panic Attack    Glen Cook is a 39 y.o. male.  Patient is a 39 year old male with past medical history of obesity, HLD, fatty liver, depression, anxiety, presenting to the ER for multiple complaints.  Patient reports that he relapsed from drinking alcohol yesterday.  Reports drinking 2-1/2 "fifth" of liquor.  His last drink was around 3 AM.  Reports that he has had 4 episodes of vomiting since then and has felt jittery with some generalized weakness and thinks he might have alcohol poisoning.  Reports having a pressure in the middle of his upper back which is worse with movement.  Reports that he was taking a medication to prevent him from drinking but that he has not filled his prescription in about a week and this is what caused him to relapse.  He is on other medications for anxiety and depression as well. Denies other drug use. Denies HI, SI. Reports that he just moved here from [place] and does not have a primary care doctor or behavioral health specialist to follow-up with.  Denies any shortness of breath, leg swelling.  Reports diaphoresis.        Past Medical History:  Diagnosis Date  . Anxiety   . Fatty liver   . High cholesterol   . Hypertension     There are no problems to display for this patient.   Past Surgical History:  Procedure Laterality Date  . ANKLE FRACTURE SURGERY Left   . clavicle fracture surgery         Family History  Problem Relation Age of Onset  . Hypertension Mother   . Ulcerative colitis Mother   . Heart attack Mother   . Hypertension Father   . Deep vein thrombosis Father     Social History   Tobacco Use  . Smoking status: Current Some Day Smoker    Types: Cigarettes  . Smokeless tobacco: Never Used  Substance Use Topics  . Alcohol use: Yes    Comment: daily  .  Drug use: Never    Home Medications Prior to Admission medications   Not on File    Allergies    Patient has no known allergies.  Review of Systems   Review of Systems  Constitutional: Positive for chills, diaphoresis and fatigue. Negative for activity change, appetite change, fever and unexpected weight change.  HENT: Positive for sinus pressure. Negative for congestion, sinus pain and sore throat.   Respiratory: Negative for cough and shortness of breath.   Cardiovascular: Negative for chest pain, palpitations and leg swelling.  Gastrointestinal: Positive for nausea and vomiting. Negative for abdominal pain and diarrhea.  Genitourinary: Negative for dysuria.  Musculoskeletal: Positive for back pain.  Skin: Negative for rash.  Neurological: Positive for light-headedness and headaches. Negative for dizziness, syncope and weakness.  Psychiatric/Behavioral: Negative for self-injury and suicidal ideas. The patient is not nervous/anxious.     Physical Exam Updated Vital Signs BP (!) 175/108   Pulse 86   Temp 99.2 F (37.3 C) (Oral)   Resp 20   Ht 6' (1.829 m)   Wt (!) 147 kg   SpO2 95%   BMI 43.94 kg/m   Physical Exam Vitals and nursing note reviewed.  Constitutional:      Appearance: Normal appearance. He is obese. He is diaphoretic.  HENT:  Head: Normocephalic.     Mouth/Throat:     Mouth: Mucous membranes are moist.  Eyes:     Conjunctiva/sclera: Conjunctivae normal.     Pupils: Pupils are equal, round, and reactive to light.  Cardiovascular:     Rate and Rhythm: Normal rate and regular rhythm.  Pulmonary:     Effort: Pulmonary effort is normal.  Musculoskeletal:     Comments: TTP diffusely in bilateral trapezius  Skin:    General: Skin is warm.  Neurological:     Mental Status: He is alert and oriented to person, place, and time.     Comments: Appears jittery and anxious  Psychiatric:        Mood and Affect: Mood normal.     ED Results / Procedures  / Treatments   Labs (all labs ordered are listed, but only abnormal results are displayed) Labs Reviewed  COMPREHENSIVE METABOLIC PANEL - Abnormal; Notable for the following components:      Result Value   AST 47 (*)    ALT 61 (*)    All other components within normal limits  CBC WITH DIFFERENTIAL/PLATELET - Abnormal; Notable for the following components:   WBC 12.6 (*)    Neutro Abs 9.3 (*)    All other components within normal limits  URINALYSIS, ROUTINE W REFLEX MICROSCOPIC - Abnormal; Notable for the following components:   Leukocytes,Ua SMALL (*)    All other components within normal limits  SARS CORONAVIRUS 2 (TAT 6-24 HRS)  RAPID URINE DRUG SCREEN, HOSP PERFORMED    EKG EKG Interpretation  Date/Time:  Sunday September 15 2019 18:57:23 EST Ventricular Rate:  75 PR Interval:    QRS Duration: 91 QT Interval:  421 QTC Calculation: 471 R Axis:   52 Text Interpretation: Sinus rhythm Anteroseptal infarct, age indeterminate unremarkable ECG Confirmed by Gerhard Munch [phone]) on 09/15/2019 8:20:56 PM   Radiology DG Chest Portable 1 View  Result Date: 09/15/2019 CLINICAL DATA:  Generalized weakness. EXAM: PORTABLE CHEST 1 VIEW COMPARISON:  None. FINDINGS: The heart size and mediastinal contours are within normal limits. Both lungs are clear. The visualized skeletal structures are unremarkable. IMPRESSION: No active disease. Electronically Signed   By: Gerome Sam III M.D   On: 09/15/2019 18:54    Procedures Procedures (including critical care time)  Medications Ordered in ED Medications  sodium chloride 0.9 % bolus 1,000 mL (0 mLs Intravenous Stopped 09/15/19 2010)  LORazepam (ATIVAN) tablet 1 mg (1 mg Oral Given 09/15/19 1849)  ondansetron (ZOFRAN) injection 4 mg (4 mg Intravenous Given 09/15/19 1849)    ED Course  I have reviewed the triage vital signs and the nursing notes.  Pertinent labs & imaging results that were available during my care of the patient were  reviewed by me and considered in my medical decision making (see chart for details).  Clinical Course as of Sep 15 2051  Wynelle Link Sep 15, 2019  1830 Patient with hx of alcohol abuse, anxiety, depression presents to the ER after relapsing on alcohol last night. He thinks he is having alcohol poisoning or anxiety attack. Patient does appear anxious. I doubt alcohol w/d since patient states he had not drank in months since last night. Doubt cardiopulmonary etiology but will obtain EKG and chest xray given his risk factors. He denies CP and SOB. Will treat with ativan for likely anxiety attack and zofran/fluids due to vomiting.   [KM]  2048 Patient reports feeling much improved after Ativan.  Work-up is reassuring including EKG,  chest x-ray, labs. Will perform covid swab given context of pandemic and refer him to new PMD. He says his medications are at the pharmacy and he can pick them up   [KM]    Clinical Course User Index [KM] Kristine Royal   MDM Rules/Calculators/A&P                      Based on review of vitals, medical screening exam, lab work and/or imaging, there does not appear to be an acute, emergent etiology for the patient's symptoms. Counseled pt on good return precautions and encouraged both PCP and ED follow-up as needed.  Prior to discharge, I also discussed incidental imaging findings with patient in detail and advised appropriate, recommended follow-up in detail.  Clinical Impression: 1. Non-intractable vomiting with nausea, unspecified vomiting type   2. Anxiety     Disposition: Discharge  Prior to providing a prescription for a controlled substance, I independently reviewed the patient's recent prescription history on the [facility]. The patient had no recent or regular prescriptions and was deemed appropriate for a brief, less than 3 day prescription of narcotic for acute analgesia.  This note was prepared with assistance of  Systems analyst. Occasional wrong-word or sound-a-like substitutions may have occurred due to the inherent limitations of voice recognition software.  Final Clinical Impression(s) / ED Diagnoses Final diagnoses:  Non-intractable vomiting with nausea, unspecified vomiting type  Anxiety    Rx / DC Orders ED Discharge Orders    None       Kristine Royal 09/15/19 2053    Carmin Muskrat, MD 09/18/19 [phone]

## 2019-09-15 NOTE — ED Notes (Signed)
Pt ambulatory to RR indepedently

## 2019-09-15 NOTE — ED Notes (Signed)
Pt made aware urine sample needed 

## 2019-09-15 NOTE — ED Notes (Signed)
Pt verbalizes understanding of DC instructions. Pt belongings returned and is ambulatory out of ED.  

## 2019-09-15 NOTE — ED Triage Notes (Signed)
Patient states "I have relapsed from being sober 3 days ago. I feel weird. I last had alcohol yesterday, but none today. I feel like I am having a panic attack." Patient denies any SI/HI, visual or auditory hallucinations.

## 2019-09-15 NOTE — Discharge Instructions (Signed)
Your labs, EKG and chest xray were reassuring today. You were given medication for anxiety and IV fluids. We have tested you for covid.  We should have the results and 24 hours.  Please go home and quarantine yourself until you know your results are negative. Thank you for allowing me to care for you today. Please return to the emergency department if you have new or worsening symptoms. Take your medications as instructed.

## 2019-09-16 LAB — SARS CORONAVIRUS 2 (TAT 6-24 HRS): SARS Coronavirus 2: NEGATIVE

## 2019-09-20 ENCOUNTER — Encounter

## 2019-09-20 MED ORDER — LOSARTAN POTASSIUM-HCTZ 100-25 MG PO TABS
100-25 MG | ORAL_TABLET | Freq: Every day | ORAL | 0 refills | Status: DC
Start: 2019-09-20 — End: 2019-10-10

## 2019-09-20 NOTE — Telephone Encounter (Signed)
John from [facility] called from [place] needing a verbal ok to refill losartan hydrochlorothiazide  100 25 mg. He stated pt recently move here and doesn't believe he has established care with new PCP.       Last OV 06-27-2019 vv with Jean Rosenthal  Last CPE  11-12-2018 with Lalley,K  Future appt. No future appt. found

## 2019-10-10 ENCOUNTER — Encounter

## 2019-10-10 MED ORDER — BUPROPION HCL ER (XL) 150 MG PO TB24
150 MG | ORAL_TABLET | Freq: Every morning | ORAL | 4 refills | Status: AC
Start: 2019-10-10 — End: ?

## 2019-10-10 MED ORDER — LOSARTAN POTASSIUM-HCTZ 100-25 MG PO TABS
100-25 MG | ORAL_TABLET | Freq: Every day | ORAL | 4 refills | Status: AC
Start: 2019-10-10 — End: ?

## 2019-10-10 MED ORDER — AMLODIPINE BESYLATE 10 MG PO TABS
10 MG | ORAL_TABLET | Freq: Every day | ORAL | 4 refills | Status: AC
Start: 2019-10-10 — End: ?

## 2019-10-10 MED ORDER — ESCITALOPRAM OXALATE 20 MG PO TABS
20 MG | ORAL_TABLET | Freq: Every day | ORAL | 4 refills | Status: DC
Start: 2019-10-10 — End: 2020-04-09

## 2019-10-10 NOTE — Telephone Encounter (Signed)
Sent to the [facility] in [place]

## 2019-10-10 NOTE — Telephone Encounter (Signed)
Medication Name:       escitalopram (LEXAPRO) 20 MG tablet    losartan-hydroCHLOROthiazide (HYZAAR) 100-25 MG per tablet    buPROPion (WELLBUTRIN XL) 150 MG extended release tablet     amLODIPine (NORVASC) 10 MG tablet    The patient has a new pharmacy and needs order for the above scripts       Medication Dosage:      Monthly Quantity Needed:     How many day supply requesting:     Medication Route:     Medication Administration Time:     Date of Last Refill: (See Medication Tab):       09-20-2019  11-12-2018  04-22-2019    If taking medication PRN - Reason for taking medication:     If this is a controlled substance do you receive this or any other controlled medication from any other doctor or facility? N/A    Ordering Physician: Dr. Jean Rosenthal     Date of Last Office Visit: 06-27-2019     Date of Next Office Visit: nne     Updated/Validated Preferred Pharmacy: Yes    Patient instructed to contact the pharmacy prior to picking up the medication: Yes

## 2019-10-10 NOTE — Telephone Encounter (Signed)
Verified pharmacy: Yes  Verified day(s) supplied: Yes  Medication:    Name: please see pended rx   Dose:    Route:   Last  appointment Visit date not found,  Next appointment is Visit date not found  Faxed?: no  Pick-up?: No   Who is picking it up?: n/a    Unable to lm due to mailbox being full, will try again later. Needs to clarify pharmacy

## 2019-11-02 ENCOUNTER — Encounter

## 2019-11-04 MED ORDER — METOPROLOL TARTRATE 100 MG PO TABS
100 MG | ORAL_TABLET | ORAL | 4 refills | Status: AC
Start: 2019-11-04 — End: ?

## 2019-11-04 NOTE — Telephone Encounter (Signed)
Verified pharmacy: Yes  Verified day(s) supplied: Yes  Medication:    Name: See pended med   Dose:    Route:   Last  appointment 06/27/2019,  Next appointment is Visit date not found  Faxed?: no  Pick-up?: Yes   Who is picking it up?: N/A

## 2020-01-23 MED ORDER — OMEPRAZOLE 20 MG PO CPDR
20 MG | ORAL_CAPSULE | ORAL | 0 refills | Status: AC
Start: 2020-01-23 — End: ?

## 2020-01-23 NOTE — Telephone Encounter (Signed)
Called pt on home phone busy signal unable to leave message.

## 2020-01-23 NOTE — Telephone Encounter (Signed)
Verified pharmacy: Yes  Verified day(s) supplied: Yes  Medication:    Name: prilosec    Dose: 20 mg   Route: oral  Last  appointment 06/27/2019,  Next appointment is Visit date not found

## 2020-01-23 NOTE — Telephone Encounter (Signed)
erx sent- pls schedule physical

## 2020-01-24 NOTE — Telephone Encounter (Signed)
Phone line still rings busy, sending my chart message as third attempt.

## 2020-04-09 ENCOUNTER — Encounter

## 2020-04-09 MED ORDER — ESCITALOPRAM OXALATE 20 MG PO TABS
20 | ORAL_TABLET | ORAL | 14 refills | Status: AC
Start: 2020-04-09 — End: ?

## 2020-05-24 ENCOUNTER — Other Ambulatory Visit: Payer: Self-pay

## 2020-05-24 ENCOUNTER — Encounter (HOSPITAL_COMMUNITY): Payer: Self-pay | Admitting: Emergency Medicine

## 2020-05-24 ENCOUNTER — Emergency Department (HOSPITAL_COMMUNITY): Payer: Medicaid Other

## 2020-05-24 ENCOUNTER — Emergency Department (HOSPITAL_COMMUNITY)
Admission: EM | Admit: 2020-05-24 | Discharge: 2020-05-24 | Disposition: A | Discharge status: Home / Self Care | Payer: Medicaid Other | Attending: Emergency Medicine | Admitting: Emergency Medicine

## 2020-05-24 DIAGNOSIS — I10 Essential (primary) hypertension: Secondary | ICD-10-CM | POA: Insufficient documentation

## 2020-05-24 DIAGNOSIS — Z20822 Contact with and (suspected) exposure to covid-19: Secondary | ICD-10-CM | POA: Diagnosis not present

## 2020-05-24 DIAGNOSIS — F17210 Nicotine dependence, cigarettes, uncomplicated: Secondary | ICD-10-CM | POA: Insufficient documentation

## 2020-05-24 DIAGNOSIS — R079 Chest pain, unspecified: Secondary | ICD-10-CM | POA: Diagnosis present

## 2020-05-24 DIAGNOSIS — K5792 Diverticulitis of intestine, part unspecified, without perforation or abscess without bleeding: Secondary | ICD-10-CM | POA: Diagnosis not present

## 2020-05-24 LAB — BASIC METABOLIC PANEL
Anion gap: 14 (ref 5–15)
BUN: 13 mg/dL (ref 6–20)
CO2: 24 mmol/L (ref 22–32)
Calcium: 8.8 mg/dL — ABNORMAL LOW (ref 8.9–10.3)
Chloride: 102 mmol/L (ref 98–111)
Creatinine, Ser: 0.81 mg/dL (ref 0.61–1.24)
GFR calc Af Amer: >60 mL/min (ref >60)
GFR calc non Af Amer: >60 mL/min (ref >60)
Glucose, Bld: 137 mg/dL — ABNORMAL HIGH (ref 70–99)
Potassium: 3.1 mmol/L — ABNORMAL LOW (ref 3.5–5.1)
Sodium: 140 mmol/L (ref 135–145)

## 2020-05-24 LAB — CBC
HCT: 44.2 % (ref 39.0–52.0)
Hemoglobin: 15.1 g/dL (ref 13.0–17.0)
MCH: 30.8 pg (ref 26.0–34.0)
MCHC: 34.2 g/dL (ref 30.0–36.0)
MCV: 90 fL (ref 80.0–100.0)
Platelets: 294 10*3/uL (ref 150–400)
RBC: 4.91 MIL/uL (ref 4.22–5.81)
RDW: 14.6 % (ref 11.5–15.5)
WBC: 9.5 10*3/uL (ref 4.0–10.5)
nRBC: 0 % (ref 0.0–0.2)

## 2020-05-24 LAB — TROPONIN I (HIGH SENSITIVITY)
Troponin I (High Sensitivity): 4 ng/L (ref <18)
Troponin I (High Sensitivity): 5 ng/L (ref <18)

## 2020-05-24 LAB — HEPATIC FUNCTION PANEL
ALT: 116 U/L — ABNORMAL HIGH (ref 0–44)
AST: 81 U/L — ABNORMAL HIGH (ref 15–41)
Albumin: 3.8 g/dL (ref 3.5–5.0)
Alkaline Phosphatase: 78 U/L (ref 38–126)
Bilirubin, Direct: 0.2 mg/dL (ref 0.0–0.2)
Indirect Bilirubin: 0.3 mg/dL (ref 0.3–0.9)
Total Bilirubin: 0.5 mg/dL (ref 0.3–1.2)
Total Protein: 7.3 g/dL (ref 6.5–8.1)

## 2020-05-24 LAB — SARS CORONAVIRUS 2 BY RT PCR (HOSPITAL ORDER, PERFORMED IN ~~LOC~~ HOSPITAL LAB): SARS Coronavirus 2: NEGATIVE

## 2020-05-24 LAB — ETHANOL: Alcohol, Ethyl (B): <10 mg/dL (ref <10)

## 2020-05-24 LAB — LIPASE, BLOOD: Lipase: 30 U/L (ref 11–51)

## 2020-05-24 MED ORDER — SODIUM CHLORIDE 0.9 % IV BOLUS
1000.0000 mL | Freq: Once | INTRAVENOUS | Status: AC
  Administered 2020-05-24: 1000 mL via INTRAVENOUS

## 2020-05-24 MED ORDER — IOHEXOL 350 MG/ML SOLN
100.0000 mL | Freq: Once | INTRAVENOUS | Status: AC | PRN
  Administered 2020-05-24: 100 mL via INTRAVENOUS

## 2020-05-24 MED ORDER — POTASSIUM CHLORIDE 20 MEQ/15ML (10%) PO SOLN
20.0000 meq | Freq: Once | ORAL | Status: AC
  Administered 2020-05-24: 20 meq via ORAL
  Filled 2020-05-24: qty 15

## 2020-05-24 MED ORDER — METRONIDAZOLE 500 MG PO TABS: 500.0000 mg | ORAL_TABLET | Freq: Two times a day (BID) | ORAL | 0 refills | Status: DC

## 2020-05-24 MED ORDER — CIPROFLOXACIN HCL 500 MG PO TABS: 500.0000 mg | ORAL_TABLET | Freq: Two times a day (BID) | ORAL | 0 refills | Status: DC

## 2020-05-24 MED ORDER — ONDANSETRON 8 MG PO TBDP: 8.0000 mg | ORAL_TABLET | Freq: Three times a day (TID) | ORAL | 0 refills | Status: AC | PRN

## 2020-05-24 MED ORDER — LORAZEPAM 2 MG/ML IJ SOLN
0.5000 mg | Freq: Once | INTRAMUSCULAR | Status: AC
  Administered 2020-05-24: 0.5 mg via INTRAVENOUS
  Filled 2020-05-24: qty 1

## 2020-05-24 MED ORDER — SODIUM CHLORIDE 0.9 % IV BOLUS: 500.0000 mL | Freq: Once | INTRAVENOUS | Status: DC

## 2020-05-24 NOTE — ED Notes (Signed)
Discharge paperwork reviewed with pt, including prescriptions.  Pt with no questions or concerns at this time, ambulatory at time of discharge.  

## 2020-05-24 NOTE — ED Triage Notes (Signed)
Patient arrives complaining of sudden onset of chest pain that woke him up around 0300. Patient states that the pain intensifies and then will die down. Patient noted to be anxious and diaphoretic.

## 2020-05-24 NOTE — ED Notes (Signed)
Blue and dark green sent to lab to save.

## 2020-05-24 NOTE — ED Provider Notes (Signed)
[facility]-EMERGENCY DEPT Provider Note   CSN: [id number] Arrival date & time: 05/24/20  0532     History Chief Complaint  Patient presents with  . Chest Pain    Glen Cook is a 39 y.o. male.  HPI 39 yo male complaining mid chest pain awoke around 3 am, stabbing into back, squeezing, with associated diaphoresis.  Pain increases with inspiration.  Feels warm, but skin feels cold.  History of alcohol abuse- currently drinking began again this weekend.  Patient had been sober 135 days.  Friday to last night drank 1 gallon of rum.  Patient denies nausea or vomiting.  Last drink at 8 p.m. and passed out and awoke with pain. PMD  Covid- j and j, 2 months ago    Past Medical History:  Diagnosis Date  . Anxiety   . Fatty liver   . High cholesterol   . Hypertension     There are no problems to display for this patient.   Past Surgical History:  Procedure Laterality Date  . ANKLE FRACTURE SURGERY Left   . clavicle fracture surgery         Family History  Problem Relation Age of Onset  . Hypertension Mother   . Ulcerative colitis Mother   . Heart attack Mother   . Hypertension Father   . Deep vein thrombosis Father     Social History   Tobacco Use  . Smoking status: Current Some Day Smoker    Types: Cigarettes  . Smokeless tobacco: Never Used  Vaping Use  . Vaping Use: Never used  Substance Use Topics  . Alcohol use: Yes    Comment: No longer daily  . Drug use: Never    Home Medications Prior to Admission medications   Not on File    Allergies    Patient has no known allergies.  Review of Systems   Review of Systems  Physical Exam Updated Vital Signs BP (!) 169/116 (BP Location: Right Arm)   Pulse 86   Temp 98 F (36.7 C) (Oral)   Resp (!) 22   Ht 1.829 m (6')   Wt (!) 149.7 kg   SpO2 98%   BMI 44.76 kg/m   Physical Exam Vitals and nursing note reviewed.  Constitutional:      Appearance: He is well-developed. He is  obese.  HENT:     Head: Normocephalic and atraumatic.     Right Ear: External ear normal.     Left Ear: External ear normal.     Nose: Nose normal.  Eyes:     Conjunctiva/sclera: Conjunctivae normal.     Pupils: Pupils are equal, round, and reactive to light.  Cardiovascular:     Rate and Rhythm: Normal rate and regular rhythm.     Heart sounds: Normal heart sounds.  Pulmonary:     Effort: Pulmonary effort is normal. No respiratory distress.     Breath sounds: Normal breath sounds. No wheezing.  Chest:     Chest wall: No tenderness.  Abdominal:     General: Bowel sounds are normal. There is no distension.     Palpations: Abdomen is soft. There is no mass.     Tenderness: There is no abdominal tenderness. There is no guarding.     Comments: Moderate ttp epigastrium left side of abdomen Soft No rebound  Musculoskeletal:        General: Normal range of motion.     Cervical back: Normal range of motion and  neck supple.  Skin:    General: Skin is warm and dry.  Neurological:     Mental Status: He is alert and oriented to person, place, and time.     Motor: No abnormal muscle tone.     Coordination: Coordination normal.     Deep Tendon Reflexes: Reflexes are normal and symmetric.  Psychiatric:        Behavior: Behavior normal.        Thought Content: Thought content normal.        Judgment: Judgment normal.     ED Results / Procedures / Treatments   Labs (all labs ordered are listed, but only abnormal results are displayed) Labs Reviewed  BASIC METABOLIC PANEL - Abnormal; Notable for the following components:      Result Value   Potassium 3.1 (*)    Glucose, Bld 137 (*)    Calcium 8.8 (*)    All other components within normal limits  CBC  TROPONIN I (HIGH SENSITIVITY)    EKG EKG Interpretation  Date/Time:  Sunday May 24 2020 05:47:25 EDT Ventricular Rate:  85 PR Interval:    QRS Duration: 95 QT Interval:  408 QTC Calculation: 486 R Axis:   49 Text  Interpretation: Normal sinus rhythm No old tracing to compare Confirmed by Margarita Grizzle [phone]) on 05/24/2020 7:19:52 AM   Radiology DG Chest Port 1 View  Result Date: 05/24/2020 CLINICAL DATA:  Chest pain EXAM: PORTABLE CHEST 1 VIEW COMPARISON:  None. FINDINGS: The heart size and mediastinal contours are within normal limits. Both lungs are clear. The visualized skeletal structures are unremarkable. IMPRESSION: No active disease. Electronically Signed   By: Deatra Robinson M.D.   On: 05/24/2020 06:54    Procedures Procedures (including critical care time)  Medications Ordered in ED Medications - No data to display  ED Course  I have reviewed the triage vital signs and the nursing notes.  Pertinent labs & imaging results that were available during my care of the patient were reviewed by me and considered in my medical decision making (see chart for details).  Clinical Course as of May 24 900  Sun May 24, 2020  0900 Labs , ct , and cxr all reviewed and plan tx for possible diverticulitis. EKG trop and repeat troponin nrmal    [DR]    Clinical Course User Index [DR] Margarita Grizzle, MD   MDM Rules/Calculators/A&P                          Patient presents with chest upper abdomen pain awoke from sleep at 0300 Patient drinking yesterday after multiple months sober Abdomen ttp on exam EKG normal Troponin and repeat trop normal CTA without evidence of dissection No pe DDX- causes of acute chest pain/abdominal pain- including acs, dissection, pe, other pulmonary causes such as pneumothorax, infection Intraabdominal sources such as gastritis, perforated ulcer, pancreatitis,  CT with possible diverticulitis Plan cipro, flagyl Doubt etoh withdrawal as patient with only 24 hours of etoh intake   Final Clinical Impression(s) / ED Diagnoses Final diagnoses:  Diverticulitis    Rx / DC Orders ED Discharge Orders    None       Margarita Grizzle, MD 05/24/20 1306

## 2020-05-24 NOTE — Discharge Instructions (Signed)
Continue omeprazole Take clear liquids Do not drink alcohol Take antibiotics as prescribed Return if unable to tolerate fluids or worsening symptoms

## 2020-06-16 ENCOUNTER — Inpatient Hospital Stay (HOSPITAL_COMMUNITY)
Admission: EM | Admit: 2020-06-16 | Discharge: 2020-06-17 | DRG: 391 | Disposition: A | Discharge status: Home / Self Care | Payer: Medicaid Other | Attending: Family Medicine | Admitting: Family Medicine

## 2020-06-16 ENCOUNTER — Emergency Department (HOSPITAL_COMMUNITY): Payer: Medicaid Other

## 2020-06-16 ENCOUNTER — Encounter (HOSPITAL_COMMUNITY): Payer: Self-pay | Admitting: Emergency Medicine

## 2020-06-16 ENCOUNTER — Other Ambulatory Visit: Payer: Self-pay

## 2020-06-16 DIAGNOSIS — E876 Hypokalemia: Secondary | ICD-10-CM | POA: Diagnosis present

## 2020-06-16 DIAGNOSIS — R6510 Systemic inflammatory response syndrome (SIRS) of non-infectious origin without acute organ dysfunction: Secondary | ICD-10-CM | POA: Diagnosis present

## 2020-06-16 DIAGNOSIS — K760 Fatty (change of) liver, not elsewhere classified: Secondary | ICD-10-CM | POA: Diagnosis present

## 2020-06-16 DIAGNOSIS — K5792 Diverticulitis of intestine, part unspecified, without perforation or abscess without bleeding: Secondary | ICD-10-CM | POA: Diagnosis present

## 2020-06-16 DIAGNOSIS — K5732 Diverticulitis of large intestine without perforation or abscess without bleeding: Secondary | ICD-10-CM | POA: Diagnosis not present

## 2020-06-16 DIAGNOSIS — Z6841 Body Mass Index (BMI) 40.0 and over, adult: Secondary | ICD-10-CM

## 2020-06-16 DIAGNOSIS — I119 Hypertensive heart disease without heart failure: Secondary | ICD-10-CM | POA: Diagnosis present

## 2020-06-16 DIAGNOSIS — K5900 Constipation, unspecified: Secondary | ICD-10-CM | POA: Diagnosis present

## 2020-06-16 DIAGNOSIS — U071 COVID-19: Secondary | ICD-10-CM | POA: Insufficient documentation

## 2020-06-16 DIAGNOSIS — F419 Anxiety disorder, unspecified: Secondary | ICD-10-CM | POA: Diagnosis present

## 2020-06-16 DIAGNOSIS — F17210 Nicotine dependence, cigarettes, uncomplicated: Secondary | ICD-10-CM | POA: Diagnosis present

## 2020-06-16 DIAGNOSIS — Z79899 Other long term (current) drug therapy: Secondary | ICD-10-CM | POA: Diagnosis not present

## 2020-06-16 DIAGNOSIS — G2581 Restless legs syndrome: Secondary | ICD-10-CM | POA: Diagnosis present

## 2020-06-16 DIAGNOSIS — I10 Essential (primary) hypertension: Secondary | ICD-10-CM | POA: Diagnosis not present

## 2020-06-16 LAB — GLUCOSE, CAPILLARY
Glucose-Capillary: 102 mg/dL — ABNORMAL HIGH (ref 70–99)
Glucose-Capillary: 97 mg/dL (ref 70–99)
Glucose-Capillary: 87 mg/dL (ref 70–99)

## 2020-06-16 LAB — URINALYSIS, ROUTINE W REFLEX MICROSCOPIC
Bilirubin Urine: NEGATIVE
Glucose, UA: NEGATIVE mg/dL
Hgb urine dipstick: NEGATIVE
Ketones, ur: 80 mg/dL — AB
Leukocytes,Ua: NEGATIVE
Nitrite: NEGATIVE
Protein, ur: 30 mg/dL — AB
Specific Gravity, Urine: 1.01 (ref 1.005–1.030)
pH: 5 (ref 5.0–8.0)

## 2020-06-16 LAB — COMPREHENSIVE METABOLIC PANEL
ALT: 41 U/L (ref 0–44)
AST: 20 U/L (ref 15–41)
Albumin: 4 g/dL (ref 3.5–5.0)
Alkaline Phosphatase: 87 U/L (ref 38–126)
Anion gap: 16 — ABNORMAL HIGH (ref 5–15)
BUN: 9 mg/dL (ref 6–20)
CO2: 21 mmol/L — ABNORMAL LOW (ref 22–32)
Calcium: 9.7 mg/dL (ref 8.9–10.3)
Chloride: 101 mmol/L (ref 98–111)
Creatinine, Ser: 0.92 mg/dL (ref 0.61–1.24)
GFR, Estimated: >60 mL/min (ref >60)
Glucose, Bld: 133 mg/dL — ABNORMAL HIGH (ref 70–99)
Potassium: 2.9 mmol/L — ABNORMAL LOW (ref 3.5–5.1)
Sodium: 138 mmol/L (ref 135–145)
Total Bilirubin: 1.7 mg/dL — ABNORMAL HIGH (ref 0.3–1.2)
Total Protein: 8.3 g/dL — ABNORMAL HIGH (ref 6.5–8.1)

## 2020-06-16 LAB — PROTIME-INR
INR: 1.1 (ref 0.8–1.2)
Prothrombin Time: 14.1 seconds (ref 11.4–15.2)

## 2020-06-16 LAB — CBC
HCT: 42.9 % (ref 39.0–52.0)
Hemoglobin: 14.5 g/dL (ref 13.0–17.0)
MCH: 30.6 pg (ref 26.0–34.0)
MCHC: 33.8 g/dL (ref 30.0–36.0)
MCV: 90.5 fL (ref 80.0–100.0)
Platelets: 285 10*3/uL (ref 150–400)
RBC: 4.74 MIL/uL (ref 4.22–5.81)
RDW: 14.4 % (ref 11.5–15.5)
WBC: 18.7 10*3/uL — ABNORMAL HIGH (ref 4.0–10.5)
nRBC: 0 % (ref 0.0–0.2)

## 2020-06-16 LAB — APTT: aPTT: 30 seconds (ref 24–36)

## 2020-06-16 LAB — LACTIC ACID, PLASMA: Lactic Acid, Venous: 0.8 mmol/L (ref 0.5–1.9)

## 2020-06-16 LAB — LIPASE, BLOOD: Lipase: 20 U/L (ref 11–51)

## 2020-06-16 LAB — RESPIRATORY PANEL BY RT PCR (FLU A&B, COVID)
Influenza A by PCR: NEGATIVE
Influenza B by PCR: NEGATIVE
SARS Coronavirus 2 by RT PCR: POSITIVE — AB

## 2020-06-16 LAB — HIV ANTIBODY (ROUTINE TESTING W REFLEX): HIV Screen 4th Generation wRfx: NONREACTIVE

## 2020-06-16 LAB — CBG MONITORING, ED: Glucose-Capillary: 106 mg/dL — ABNORMAL HIGH (ref 70–99)

## 2020-06-16 MED ORDER — LACTATED RINGERS IV BOLUS (SEPSIS)
1000.0000 mL | Freq: Once | INTRAVENOUS | Status: AC
  Administered 2020-06-16: 1000 mL via INTRAVENOUS

## 2020-06-16 MED ORDER — BUPROPION HCL ER (XL) 150 MG PO TB24
150.0000 mg | ORAL_TABLET | Freq: Every morning | ORAL | Status: DC
  Administered 2020-06-17: 150 mg via ORAL
  Filled 2020-06-16 (×2): qty 1

## 2020-06-16 MED ORDER — POTASSIUM CHLORIDE 10 MEQ/100ML IV SOLN
10.0000 meq | Freq: Once | INTRAVENOUS | Status: AC
  Administered 2020-06-16: 10 meq via INTRAVENOUS
  Filled 2020-06-16: qty 100

## 2020-06-16 MED ORDER — VANCOMYCIN HCL 2000 MG/400ML IV SOLN
2000.0000 mg | INTRAVENOUS | Status: AC
  Administered 2020-06-16: 2000 mg via INTRAVENOUS
  Filled 2020-06-16: qty 400

## 2020-06-16 MED ORDER — METOPROLOL TARTRATE 5 MG/5ML IV SOLN
5.0000 mg | Freq: Four times a day (QID) | INTRAVENOUS | Status: DC
  Administered 2020-06-16 (×2): 5 mg via INTRAVENOUS
  Filled 2020-06-16 (×3): qty 5

## 2020-06-16 MED ORDER — AMLODIPINE BESYLATE 10 MG PO TABS
10.0000 mg | ORAL_TABLET | Freq: Every day | ORAL | Status: DC
  Administered 2020-06-16 – 2020-06-17 (×2): 10 mg via ORAL
  Filled 2020-06-16 (×2): qty 1

## 2020-06-16 MED ORDER — PIPERACILLIN-TAZOBACTAM 3.375 G IVPB 30 MIN
3.3750 g | INTRAVENOUS | Status: AC
  Administered 2020-06-16: 3.375 g via INTRAVENOUS
  Filled 2020-06-16: qty 50

## 2020-06-16 MED ORDER — SODIUM CHLORIDE (PF) 0.9 % IJ SOLN
INTRAMUSCULAR | Status: AC
  Filled 2020-06-16: qty 50

## 2020-06-16 MED ORDER — SODIUM CHLORIDE 0.9 % IV BOLUS
1000.0000 mL | Freq: Once | INTRAVENOUS | Status: AC
  Administered 2020-06-16: 1000 mL via INTRAVENOUS

## 2020-06-16 MED ORDER — PIPERACILLIN-TAZOBACTAM 3.375 G IVPB
3.3750 g | Freq: Three times a day (TID) | INTRAVENOUS | Status: DC
  Administered 2020-06-16 – 2020-06-17 (×4): 3.375 g via INTRAVENOUS
  Filled 2020-06-16 (×4): qty 50

## 2020-06-16 MED ORDER — KCL IN DEXTROSE-NACL 20-5-0.9 MEQ/L-%-% IV SOLN
INTRAVENOUS | Status: AC
  Filled 2020-06-16 (×2): qty 1000

## 2020-06-16 MED ORDER — ACETAMINOPHEN 325 MG PO TABS: 650.0000 mg | ORAL_TABLET | Freq: Four times a day (QID) | ORAL | Status: DC | PRN

## 2020-06-16 MED ORDER — HYDRALAZINE HCL 20 MG/ML IJ SOLN: 10.0000 mg | INTRAMUSCULAR | Status: DC | PRN

## 2020-06-16 MED ORDER — LOSARTAN POTASSIUM-HCTZ 100-25 MG PO TABS: 1.0000 | ORAL_TABLET | Freq: Every day | ORAL | Status: DC

## 2020-06-16 MED ORDER — LOSARTAN POTASSIUM 50 MG PO TABS
100.0000 mg | ORAL_TABLET | Freq: Every day | ORAL | Status: DC
  Administered 2020-06-16 – 2020-06-17 (×2): 100 mg via ORAL
  Filled 2020-06-16 (×2): qty 2

## 2020-06-16 MED ORDER — METOPROLOL TARTRATE 50 MG PO TABS
100.0000 mg | ORAL_TABLET | Freq: Two times a day (BID) | ORAL | Status: DC
  Administered 2020-06-16 – 2020-06-17 (×3): 100 mg via ORAL
  Filled 2020-06-16 (×2): qty 2

## 2020-06-16 MED ORDER — MORPHINE SULFATE (PF) 4 MG/ML IV SOLN
4.0000 mg | Freq: Once | INTRAVENOUS | Status: AC
  Administered 2020-06-16: 4 mg via INTRAVENOUS
  Filled 2020-06-16: qty 1

## 2020-06-16 MED ORDER — ESCITALOPRAM OXALATE 20 MG PO TABS
20.0000 mg | ORAL_TABLET | Freq: Every day | ORAL | Status: DC
  Administered 2020-06-16 – 2020-06-17 (×2): 20 mg via ORAL
  Filled 2020-06-16 (×2): qty 1

## 2020-06-16 MED ORDER — POTASSIUM CHLORIDE CRYS ER 20 MEQ PO TBCR
40.0000 meq | EXTENDED_RELEASE_TABLET | Freq: Once | ORAL | Status: AC
  Administered 2020-06-16: 40 meq via ORAL
  Filled 2020-06-16: qty 2

## 2020-06-16 MED ORDER — ROPINIROLE HCL 1 MG PO TABS
1.0000 mg | ORAL_TABLET | Freq: Every day | ORAL | Status: DC
  Administered 2020-06-16: 1 mg via ORAL
  Filled 2020-06-16: qty 1

## 2020-06-16 MED ORDER — ACETAMINOPHEN 500 MG PO TABS
1000.0000 mg | ORAL_TABLET | Freq: Once | ORAL | Status: AC
  Administered 2020-06-16: 1000 mg via ORAL
  Filled 2020-06-16: qty 2

## 2020-06-16 MED ORDER — IOHEXOL 300 MG/ML  SOLN
100.0000 mL | Freq: Once | INTRAMUSCULAR | Status: AC | PRN
  Administered 2020-06-16: 100 mL via INTRAVENOUS

## 2020-06-16 MED ORDER — METRONIDAZOLE IN NACL 5-0.79 MG/ML-% IV SOLN
500.0000 mg | Freq: Once | INTRAVENOUS | Status: AC
  Administered 2020-06-16: 500 mg via INTRAVENOUS
  Filled 2020-06-16: qty 100

## 2020-06-16 MED ORDER — CIPROFLOXACIN IN D5W 400 MG/200ML IV SOLN
400.0000 mg | Freq: Once | INTRAVENOUS | Status: AC
  Administered 2020-06-16: 400 mg via INTRAVENOUS
  Filled 2020-06-16: qty 200

## 2020-06-16 MED ORDER — PANTOPRAZOLE SODIUM 40 MG PO TBEC
40.0000 mg | DELAYED_RELEASE_TABLET | Freq: Every day | ORAL | Status: DC
  Administered 2020-06-16 – 2020-06-17 (×2): 40 mg via ORAL
  Filled 2020-06-16 (×2): qty 1

## 2020-06-16 MED ORDER — ACETAMINOPHEN 650 MG RE SUPP: 650.0000 mg | Freq: Four times a day (QID) | RECTAL | Status: DC | PRN

## 2020-06-16 MED ORDER — DOXEPIN HCL 50 MG PO CAPS
50.0000 mg | ORAL_CAPSULE | Freq: Every day | ORAL | Status: DC
  Administered 2020-06-16: 50 mg via ORAL
  Filled 2020-06-16 (×2): qty 1

## 2020-06-16 MED ORDER — MORPHINE SULFATE (PF) 2 MG/ML IV SOLN
2.0000 mg | INTRAVENOUS | Status: DC | PRN
  Administered 2020-06-16 (×2): 2 mg via INTRAVENOUS
  Filled 2020-06-16 (×2): qty 1

## 2020-06-16 MED ORDER — ONDANSETRON HCL 4 MG/2ML IJ SOLN
4.0000 mg | Freq: Once | INTRAMUSCULAR | Status: AC
  Administered 2020-06-16: 4 mg via INTRAVENOUS
  Filled 2020-06-16: qty 2

## 2020-06-16 MED ORDER — HYDROCHLOROTHIAZIDE 25 MG PO TABS
25.0000 mg | ORAL_TABLET | Freq: Every day | ORAL | Status: DC
  Administered 2020-06-16 – 2020-06-17 (×2): 25 mg via ORAL
  Filled 2020-06-16 (×2): qty 1

## 2020-06-16 NOTE — Plan of Care (Signed)
Problem: Education: Goal: Knowledge of General Education information will improve Description: Including pain rating scale, medication(s)/side effects and non-pharmacologic comfort measures 06/16/2020 2315 by Cheron Schaumann, RN Outcome: Progressing 06/16/2020 2315 by Cheron Schaumann, RN Outcome: Progressing 06/16/2020 2315 by Cheron Schaumann, RN Outcome: Progressing   Problem: Health Behavior/Discharge Planning: Goal: Ability to manage health-related needs will improve 06/16/2020 2315 by Cheron Schaumann, RN Outcome: Progressing 06/16/2020 2315 by Cheron Schaumann, RN Outcome: Progressing 06/16/2020 2315 by Cheron Schaumann, RN Outcome: Progressing   Problem: Clinical Measurements: Goal: Ability to maintain clinical measurements within normal limits will improve 06/16/2020 2315 by Cheron Schaumann, RN Outcome: Progressing 06/16/2020 2315 by Cheron Schaumann, RN Outcome: Progressing 06/16/2020 2315 by Cheron Schaumann, RN Outcome: Progressing Goal: Will remain free from infection 06/16/2020 2315 by Cheron Schaumann, RN Outcome: Progressing 06/16/2020 2315 by Cheron Schaumann, RN Outcome: Progressing 06/16/2020 2315 by Cheron Schaumann, RN Outcome: Progressing Goal: Diagnostic test results will improve 06/16/2020 2315 by Cheron Schaumann, RN Outcome: Progressing 06/16/2020 2315 by Cheron Schaumann, RN Outcome: Progressing 06/16/2020 2315 by Cheron Schaumann, RN Outcome: Progressing Goal: Respiratory complications will improve 06/16/2020 2315 by Cheron Schaumann, RN Outcome: Progressing 06/16/2020 2315 by Cheron Schaumann, RN Outcome: Progressing 06/16/2020 2315 by Cheron Schaumann, RN Outcome: Progressing Goal: Cardiovascular complication will be avoided 06/16/2020 2315 by Cheron Schaumann, RN Outcome: Progressing 06/16/2020 2315 by Cheron Schaumann, RN Outcome: Progressing 06/16/2020 2315 by Cheron Schaumann, RN Outcome: Progressing    Problem: Activity: Goal: Risk for activity intolerance will decrease 06/16/2020 2315 by Cheron Schaumann, RN Outcome: Progressing 06/16/2020 2315 by Cheron Schaumann, RN Outcome: Progressing 06/16/2020 2315 by Cheron Schaumann, RN Outcome: Progressing   Problem: Nutrition: Goal: Adequate nutrition will be maintained 06/16/2020 2315 by Cheron Schaumann, RN Outcome: Progressing 06/16/2020 2315 by Cheron Schaumann, RN Outcome: Progressing 06/16/2020 2315 by Cheron Schaumann, RN Outcome: Progressing   Problem: Coping: Goal: Level of anxiety will decrease 06/16/2020 2315 by Cheron Schaumann, RN Outcome: Progressing 06/16/2020 2315 by Cheron Schaumann, RN Outcome: Progressing 06/16/2020 2315 by Cheron Schaumann, RN Outcome: Progressing   Problem: Elimination: Goal: Will not experience complications related to bowel motility 06/16/2020 2315 by Cheron Schaumann, RN Outcome: Progressing 06/16/2020 2315 by Cheron Schaumann, RN Outcome: Progressing 06/16/2020 2315 by Cheron Schaumann, RN Outcome: Progressing Goal: Will not experience complications related to urinary retention 06/16/2020 2315 by Cheron Schaumann, RN Outcome: Progressing 06/16/2020 2315 by Cheron Schaumann, RN Outcome: Progressing 06/16/2020 2315 by Cheron Schaumann, RN Outcome: Progressing   Problem: Pain Managment: Goal: General experience of comfort will improve 06/16/2020 2315 by Cheron Schaumann, RN Outcome: Progressing 06/16/2020 2315 by Cheron Schaumann, RN Outcome: Progressing 06/16/2020 2315 by Cheron Schaumann, RN Outcome: Progressing   Problem: Safety: Goal: Ability to remain free from injury will improve 06/16/2020 2315 by Cheron Schaumann, RN Outcome: Progressing 06/16/2020 2315 by Cheron Schaumann, RN Outcome: Progressing 06/16/2020 2315 by Cheron Schaumann, RN Outcome: Progressing   Problem: Skin Integrity: Goal: Risk for impaired skin integrity will  decrease 06/16/2020 2315 by Cheron Schaumann, RN Outcome: Progressing 06/16/2020 2315 by Cheron Schaumann, RN Outcome: Progressing 06/16/2020 2315 by Cheron Schaumann, RN Outcome: Progressing   Problem: Education: Goal: Knowledge of risk factors and measures for prevention of condition will improve 06/16/2020 2315 by Cheron Schaumann, RN Outcome: Progressing 06/16/2020 2315  by Cheron Schaumann, RN Outcome: Progressing 06/16/2020 2315 by Cheron Schaumann, RN Outcome: Progressing   Problem: Coping: Goal: Psychosocial and spiritual needs will be supported 06/16/2020 2315 by Cheron Schaumann, RN Outcome: Progressing 06/16/2020 2315 by Cheron Schaumann, RN Outcome: Progressing 06/16/2020 2315 by Cheron Schaumann, RN Outcome: Progressing   Problem: Respiratory: Goal: Will maintain a patent airway 06/16/2020 2315 by Cheron Schaumann, RN Outcome: Progressing 06/16/2020 2315 by Cheron Schaumann, RN Outcome: Progressing 06/16/2020 2315 by Cheron Schaumann, RN Outcome: Progressing Goal: Complications related to the disease process, condition or treatment will be avoided or minimized 06/16/2020 2315 by Cheron Schaumann, RN Outcome: Progressing 06/16/2020 2315 by Cheron Schaumann, RN Outcome: Progressing 06/16/2020 2315 by Cheron Schaumann, RN Outcome: Progressing

## 2020-06-16 NOTE — Progress Notes (Signed)
Pharmacy Antibiotic Note  Glen Cook is a 39 y.o. male admitted on 06/16/2020 with acute diverticulitis.  Recently diagnosed with diverticulitis and Covid in the past month. In the ED, patient received Vancomycin 2gm and Zosyn 3.375gm IV x 1 dose each.  Upon admission, Pharmacy has been consulted for Zosyn dosing.  Plan: Zosyn 3.375g IV q8h (4 hour infusion).  Need for further dosage adjustment appears unlikely at present.    Will sign off at this time.  Please reconsult if a change in clinical status warrants re-evaluation of dosage.    Height: 6' (182.9 cm) Weight: (!) 149.7 kg (330 lb) IBW/kg (Calculated) : 77.6  Temp (24hrs), Avg:99.4 F (37.4 C), Min:98.3 F (36.8 C), Max:100.4 F (38 C)  Recent Labs  Lab 06/16/20 0137 06/16/20 0252  WBC 18.7*  --   CREATININE 0.92  --   LATICACIDVEN  --  0.8    Estimated Creatinine Clearance: 165.4 mL/min (by C-G formula based on SCr of 0.92 mg/dL).    No Known Allergies  Thank you for allowing pharmacy to be a part of this patient's care.  Maryellen Pile, PharmD 06/16/2020 5:54 AM

## 2020-06-16 NOTE — H&P (Signed)
History and Physical    Nasim Habeeb IFO:[id number] DOB: [birth year] DOA: 06/16/2020  PCP: Pcp, No  Patient coming from: Home.  Chief Complaint: Abdominal pain and nausea vomiting and diarrhea.  HPI: Glen Cook is a 39 y.o. male with history of hypertension, morbid obesity recently diagnosed with Covid infection last month and also diverticulitis diagnosed last month and was treated with Cipro and Flagyl and patient only took Flagyl since patient was instructed that Cipro had interaction with some of his other medication to Flagyl for on Friday symptoms resolved presents to the ER because of abdominal pain which is mostly periumbilical with recurrent episodes of nausea vomiting diarrhea with fever and chills over the last 48 hours. Denies any blood in the vomitus or diarrhea.  ED Course: In the ER patient was tachycardic febrile with temperature 101 F. Lactic acid was normal. Labs are significant for WBC count of 18.7 potassium 2.9 anion gap of 16. CT scan shows extensive inflammation involving the small loop concerning for diverticulitis. Patient was started empiric antibiotics fluids and admitted for further management. Covid test is positive. Chest x-ray unremarkable patient is not hypoxic.  Review of Systems: As per HPI, rest all negative.   Past Medical History:  Diagnosis Date   Anxiety    Fatty liver    High cholesterol    Hypertension     Past Surgical History:  Procedure Laterality Date   ANKLE FRACTURE SURGERY Left    clavicle fracture surgery       reports that he has been smoking cigarettes. He has never used smokeless tobacco. He reports current alcohol use. He reports that he does not use drugs.  No Known Allergies  Family History  Problem Relation Age of Onset   Hypertension Mother    Ulcerative colitis Mother    Heart attack Mother    Hypertension Father    Deep vein thrombosis Father     Prior to Admission medications   Medication Sig Start  Date End Date Taking? Authorizing Provider  amLODipine (NORVASC) 10 MG tablet Take 10 mg by mouth daily. 01/26/20  Yes [provider]  buPROPion (WELLBUTRIN XL) 150 MG 24 hr tablet Take 1 tablet by mouth every morning. 11/12/18  Yes [provider]  ciprofloxacin (CIPRO) 500 MG tablet Take 1 tablet (500 mg total) by mouth every 12 (twelve) hours. 05/24/20  Yes Margarita Grizzle, MD  doxepin (SINEQUAN) 50 MG capsule Take 50 mg by mouth at bedtime. 01/23/20  Yes [provider]  escitalopram (LEXAPRO) 20 MG tablet Take 1 tablet by mouth daily. 04/09/20  Yes [provider]  losartan-hydrochlorothiazide (HYZAAR) 100-25 MG tablet Take 1 tablet by mouth daily. 02/19/20  Yes [provider]  metoprolol tartrate (LOPRESSOR) 100 MG tablet Take 100 mg by mouth 2 (two) times daily. 02/19/20  Yes [provider]  omeprazole (PRILOSEC) 20 MG capsule Take 20 mg by mouth daily.  01/23/20  Yes [provider]  rOPINIRole (REQUIP) 1 MG tablet Take 1 mg by mouth at bedtime.  02/25/20  Yes [provider]  metroNIDAZOLE (FLAGYL) 500 MG tablet Take 1 tablet (500 mg total) by mouth 2 (two) times daily. Patient not taking: Reported on 06/16/2020 05/24/20   Margarita Grizzle, MD  ondansetron (ZOFRAN ODT) 8 MG disintegrating tablet Take 1 tablet (8 mg total) by mouth every 8 (eight) hours as needed for nausea or vomiting. Patient not taking: Reported on 06/16/2020 05/24/20   Margarita Grizzle, MD    Physical Exam:  Constitutional: Moderately built and nourished. Vitals:   06/16/20 0237 06/16/20 0301 06/16/20 0400 06/16/20 0430  BP: (!) 153/80  (!) 145/80 (!) 150/82  Pulse: (!) 108  (!) 105 (!) 101  Resp: 18  20 19   Temp:  (!) 100.4 F (38 C)    TempSrc:  Rectal    SpO2: 98%  96% 97%  Weight:      Height:       Eyes: Anicteric no pallor. ENMT: No discharge from the ears eyes nose or mouth. Neck: No mass felt. No neck rigidity. Respiratory: No rhonchi or  crepitations. Cardiovascular: S1-S2 heard. Abdomen: Soft tenderness present around umbilicus. No guarding or rigidity. Musculoskeletal: No edema. Skin: No rash. Neurologic: Alert awake oriented time place and person. Moves all extremities. Psychiatric: Appears normal. Normal affect.   Labs on Admission: I have personally reviewed following labs and imaging studies  CBC: Recent Labs  Lab 06/16/20 0137  WBC 18.7*  HGB 14.5  HCT 42.9  MCV 90.5  PLT 285   Basic Metabolic Panel: Recent Labs  Lab 06/16/20 0137  NA 138  K 2.9*  CL 101  CO2 21*  GLUCOSE 133*  BUN 9  CREATININE 0.92  CALCIUM 9.7   GFR: Estimated Creatinine Clearance: 165.4 mL/min (by C-G formula based on SCr of 0.92 mg/dL). Liver Function Tests: Recent Labs  Lab 06/16/20 0137  AST 20  ALT 41  ALKPHOS 87  BILITOT 1.7*  PROT 8.3*  ALBUMIN 4.0   Recent Labs  Lab 06/16/20 0137  LIPASE 20   No results for input(s): AMMONIA in the last 168 hours. Coagulation Profile: Recent Labs  Lab 06/16/20 0351  INR 1.1   Cardiac Enzymes: No results for input(s): CKTOTAL, CKMB, CKMBINDEX, TROPONINI in the last 168 hours. BNP (last 3 results) No results for input(s): PROBNP in the last 8760 hours. HbA1C: No results for input(s): HGBA1C in the last 72 hours. CBG: No results for input(s): GLUCAP in the last 168 hours. Lipid Profile: No results for input(s): CHOL, HDL, LDLCALC, TRIG, CHOLHDL, LDLDIRECT in the last 72 hours. Thyroid Function Tests: No results for input(s): TSH, T4TOTAL, FREET4, T3FREE, THYROIDAB in the last 72 hours. Anemia Panel: No results for input(s): VITAMINB12, FOLATE, FERRITIN, TIBC, IRON, RETICCTPCT in the last 72 hours. Urine analysis:    Component Value Date/Time   COLORURINE YELLOW 09/15/2019 1853   APPEARANCEUR CLEAR 09/15/2019 1853   LABSPEC 1.017 09/15/2019 1853   PHURINE 6.0 09/15/2019 1853   GLUCOSEU NEGATIVE 09/15/2019 1853   HGBUR NEGATIVE 09/15/2019 1853    BILIRUBINUR NEGATIVE 09/15/2019 1853   KETONESUR NEGATIVE 09/15/2019 1853   PROTEINUR NEGATIVE 09/15/2019 1853   NITRITE NEGATIVE 09/15/2019 1853   LEUKOCYTESUR SMALL (A) 09/15/2019 1853   Sepsis Labs: @LABRCNTIP (procalcitonin:4,lacticidven:4) ) Recent Results (from the past 240 hour(s))  Respiratory Panel by RT PCR (Flu A&B, Covid) - Nasopharyngeal Swab     Status: Abnormal   Collection Time: 06/16/20  1:39 AM   Specimen: Nasopharyngeal Swab  Result Value Ref Range Status   SARS Coronavirus 2 by RT PCR POSITIVE (A) NEGATIVE Final    Comment: HODGES I. 10.12.2021 @ 0415 BY MECIAL J. RESULT CALLED TO, READ BACK BY AND VERIFIED WITH: (NOTE) SARS-CoV-2 target nucleic acids are DETECTED.  SARS-CoV-2 RNA is generally detectable in upper respiratory specimens  during the acute phase of infection. Positive results are indicative of the presence of the identified virus, but do not rule out bacterial infection or co-infection with other pathogens not detected  by the test. Clinical correlation with patient history and other diagnostic information is necessary to determine patient infection status. The expected result is Negative.  Fact Sheet for Patients:  https://www.moore.com/  Fact Sheet for Healthcare Providers: https://www.young.biz/  This test is not yet approved or cleared by the [place] FDA and  has been authorized for detection and/or diagnosis of SARS-CoV-2 by FDA under an Emergency Use Authorization (EUA).  This EUA will remain in effect (meaning this test c an be used) for the duration of  the COVID-19 declaration under Section 564(b)(1) of the Act, 21 U.S.C. section 360bbb-3(b)(1), unless the authorization is terminated or revoked sooner.      Influenza A by PCR NEGATIVE NEGATIVE Final   Influenza B by PCR NEGATIVE NEGATIVE Final    Comment: (NOTE) The Xpert Xpress SARS-CoV-2/FLU/RSV assay is intended as an aid in  the  diagnosis of influenza from Nasopharyngeal swab specimens and  should not be used as a sole basis for treatment. Nasal washings and  aspirates are unacceptable for Xpert Xpress SARS-CoV-2/FLU/RSV  testing.  Fact Sheet for Patients: https://www.moore.com/  Fact Sheet for Healthcare Providers: https://www.young.biz/  This test is not yet approved or cleared by the [place] FDA and  has been authorized for detection and/or diagnosis of SARS-CoV-2 by  FDA under an Emergency Use Authorization (EUA). This EUA will remain  in effect (meaning this test can be used) for the duration of the  Covid-19 declaration under Section 564(b)(1) of the Act, 21  U.S.C. section 360bbb-3(b)(1), unless the authorization is  terminated or revoked. Performed at [facility], [address], [place], [place] [address]      Radiological Exams on Admission: CT ABDOMEN PELVIS W CONTRAST  Result Date: 06/16/2020 CLINICAL DATA:  Unspecified abdominal pain, nausea, vomiting EXAM: CT ABDOMEN AND PELVIS WITH CONTRAST TECHNIQUE: Multidetector CT imaging of the abdomen and pelvis was performed using the standard protocol following bolus administration of intravenous contrast. CONTRAST:  OMNIPAQUE IOHEXOL 300 MG/ML  SOLN COMPARISON:  None. FINDINGS: Lower chest: The visualized lung bases are clear bilaterally. Visualized heart and pericardium are unremarkable. Hepatobiliary: Moderate hepatic steatosis and mild hepatomegaly is stable. No focal liver lesion. No intra or extrahepatic biliary ductal dilation. Gallbladder unremarkable. Pancreas: Unremarkable Spleen: Mild splenomegaly has progressed slightly since prior examination, with the spleen now measuring 16.2 cm in greatest dimension. No intrasplenic lesion. The splenic vein is patent. Adrenals/Urinary Tract: The adrenal glands are unremarkable. The kidneys are normal. The bladder is largely decompressed.  Inflammatory changes related to the inflammatory process within the pelvis described below abut the anterior bladder dome, however, the bladder is otherwise unremarkable. Stomach/Bowel: There is again noted circumferential thickening involving the mid sigmoid colon with extensive surrounding inflammatory stranding the small amount of ill-defined pericolonic phlegmonous change within the sigmoid mesentery is in keeping with changes of moderate sigmoid diverticulitis. This appears progressive since prior examination. Inflammatory change now abuts the bladder and encompasses several loops of distal small bowel though a direct fistula is not clearly identified. There is no free intraperitoneal gas or fluid. No discrete loculated intra-abdominal fluid collections. There is no evidence of obstruction. The appendix is unremarkable. Vascular/Lymphatic: The abdominal vasculature is unremarkable. No aortic aneurysm. No pathologic lymphadenopathy within the abdomen and pelvis. Reproductive: Prostate is unremarkable. Other: Rectum unremarkable Musculoskeletal: No acute bone abnormality. IMPRESSION: Progressive inflammatory changes in keeping with moderate, uncomplicated sigmoid diverticulitis. Inflammatory changes now in compass several loops of distal small bowel and abut the  bladder, however, a discrete fistula is not identified. No evidence of obstruction or perforation. As noted previously, endoscopic correlation may be helpful once the patient's acute issues have resolved to confirm the absence of an underlying mass lesion. Stable moderate hepatic steatosis and mild hepatomegaly. Progressive mild splenomegaly. Electronically Signed   By: Helyn NumbersAshesh  Parikh MD   On: 06/16/2020 04:12   DG Chest Port 1 View  Result Date: 06/16/2020 CLINICAL DATA:  Possible sepsis. EXAM: PORTABLE CHEST 1 VIEW COMPARISON:  05/24/2020 FINDINGS: The lungs are clear without focal pneumonia, edema, pneumothorax or pleural effusion.  Cardiopericardial silhouette is at upper limits of normal for size. Status post ORIF left clavicle fracture. Telemetry leads overlie the chest. IMPRESSION: No acute cardiopulmonary findings. Electronically Signed   By: Kennith CenterEric  Mansell M.D.   On: 06/16/2020 05:00    EKG: Independently reviewed. Sinus tachycardia.  Assessment/Plan Principal Problem:   SIRS (systemic inflammatory response syndrome) (HCC) Active Problems:   Essential hypertension   Acute diverticulitis   Hypokalemia    1. SIRS secondary to extensive inflammation involving the small bowel concerning for diverticulitis for which we will keep patient n.p.o. antibiotics fluids pain relief medications and consult gastroenterologist. Note that patient's mom does have history of ulcerative colitis. 2. Hypertension since patient is n.p.o. we will keep patient on scheduled dose of IV metoprolol and as needed IV hydralazine. 3. Hypokalemia from vomiting and diarrhea replace and recheck. 4. Morbid obesity will need counseling. 5. History of restless leg syndrome presently n.p.o. 6. Covid infection positive but patient is asymptomatic. Patient also was positive last month.  Since patient has SIRS picture on presentation with extensive inflammation involving the small bowel will need close monitoring for any further worsening in inpatient status.   DVT prophylaxis: SCDs. Code Status: Full code. Family Communication: Discussed with patient. Disposition Plan: Home. Consults called: None. Admission status: Inpatient.   Eduard ClosArshad N Colsen Modi MD [facility] Pager [phone].  If 7PM-7AM, please contact night-coverage [email or web address] Password [email or web address]  06/16/2020, 5:44 AM

## 2020-06-16 NOTE — Progress Notes (Signed)
PROGRESS NOTE    Glen Cook  YBO:[id number] DOB: [birth year] DOA: 06/16/2020 PCP: Pcp, No  Chief Complaint  Patient presents with  . Abdominal Pain   Brief Narrative:  Glen Cook is Glen Cook 39 y.o. male with history of hypertension, morbid obesity recently diagnosed with Covid infection last month and also diverticulitis diagnosed last month and was treated with Cipro and Flagyl and patient only took Flagyl since patient was instructed that Cipro had interaction with some of his other medication to Flagyl for on Friday symptoms resolved presents to the ER because of abdominal pain which is mostly periumbilical with recurrent episodes of nausea vomiting diarrhea with fever and chills over the last 48 hours. Denies any blood in the vomitus or diarrhea.  ED Course: In the ER patient was tachycardic febrile with temperature 101 F. Lactic acid was normal. Labs are significant for WBC count of 18.7 potassium 2.9 anion gap of 16. CT scan shows extensive inflammation involving the small loop concerning for diverticulitis. Patient was started empiric antibiotics fluids and admitted for further management. Covid test is positive. Chest x-ray unremarkable patient is not hypoxic.  Assessment & Plan:   Principal Problem:   SIRS (systemic inflammatory response syndrome) (HCC) Active Problems:   Essential hypertension   Acute diverticulitis   Hypokalemia  1. SIRS  Sigmoid Diverticulitis  Inflammatory Changes in Distal Small Bowel: fam hx ulcerative colitis in mother 1. Continue zosyn 2. Clear liquid diet 3. GI consult, appreciate recs -> 14 days abx, high fiber diet, outpatient colonoscopy once diverticulitis resolved tor/o inflammatory bowel dz -> f/u GI in 4-6 weeks      2. Hypertension - resume home meds 3. Hypokalemia - replace and follow  4. Morbid obesity will need counseling. 5. History of restless leg syndrome - resume home meds 6. Covid infection positive but patient is asymptomatic.  Patient also was positive last month.  Will try to get outside records and d/c precautions if appropriate  DVT prophylaxis: SCD Code Status: full  Family Communication: none at bedside Disposition:   Status is: Inpatient  Remains inpatient appropriate because:Inpatient level of care appropriate due to severity of illness   Dispo: The patient is from: Home              Anticipated d/c is to: Home              Anticipated d/c date is: > 3 days              Patient currently is not medically stable to d/c.       Consultants:   GI  Procedures:   none  Antimicrobials:  Anti-infectives (From admission, onward)   Start     Dose/Rate Route Frequency Ordered Stop   06/16/20 1200  piperacillin-tazobactam (ZOSYN) IVPB 3.375 g        3.375 g 12.5 mL/hr over 240 Minutes Intravenous Every 8 hours 06/16/20 0553     06/16/20 0530  ciprofloxacin (CIPRO) IVPB 400 mg        400 mg 200 mL/hr over 60 Minutes Intravenous  Once 06/16/20 0521 06/16/20 0759   06/16/20 0530  metroNIDAZOLE (FLAGYL) IVPB 500 mg        500 mg 100 mL/hr over 60 Minutes Intravenous  Once 06/16/20 0521 06/16/20 0759   06/16/20 0400  vancomycin (VANCOREADY) IVPB 2000 mg/400 mL        2,000 mg 200 mL/hr over 120 Minutes Intravenous STAT 06/16/20 0358 06/16/20 0656   06/16/20 0400  piperacillin-tazobactam (ZOSYN) IVPB 3.375 g        3.375 g 100 mL/hr over 30 Minutes Intravenous To Emergency Dept 06/16/20 0358 06/16/20 0442        Subjective: C/o abdominal pain, constipation  Objective: Vitals:   06/16/20 0700 06/16/20 0730 06/16/20 0800 06/16/20 0934  BP: (!) 149/95 138/87 138/76 126/71  Pulse: 83 85 87 87  Resp: 20 (!) 23 19 19   Temp:   (!) 97.5 F (36.4 C) 97.8 F (36.6 C)  TempSrc:   Oral   SpO2: 96% 96% 99% 97%  Weight:      Height:        Intake/Output Summary (Last 24 hours) at 06/16/2020 1428 Last data filed at 06/16/2020 0803 Gross per 24 hour  Intake 2862.26 ml  Output --  Net 2862.26  ml   Filed Weights   06/16/20 0134  Weight: (!) 149.7 kg    Examination:  General exam: Appears calm and comfortable  Respiratory system: Clear to auscultation. Respiratory effort normal. Cardiovascular system: S1 & S2 heard, RRR.  Gastrointestinal system: Abdomen is nondistended, soft and nontender Central nervous system: Alert and oriented. No focal neurological deficits. Extremities: no LEE Skin: No rashes, lesions or ulcers Psychiatry: Judgement and insight appear normal. Mood & affect appropriate.     Data Reviewed: I have personally reviewed following labs and imaging studies  CBC: Recent Labs  Lab 06/16/20 0137  WBC 18.7*  HGB 14.5  HCT 42.9  MCV 90.5  PLT 285    Basic Metabolic Panel: Recent Labs  Lab 06/16/20 0137  NA 138  K 2.9*  CL 101  CO2 21*  GLUCOSE 133*  BUN 9  CREATININE 0.92  CALCIUM 9.7    GFR: Estimated Creatinine Clearance: 165.4 mL/min (by C-G formula based on SCr of 0.92 mg/dL).  Liver Function Tests: Recent Labs  Lab 06/16/20 0137  AST 20  ALT 41  ALKPHOS 87  BILITOT 1.7*  PROT 8.3*  ALBUMIN 4.0    CBG: Recent Labs  Lab 06/16/20 0628 06/16/20 1103  GLUCAP 106* 102*     Recent Results (from the past 240 hour(s))  Respiratory Panel by RT PCR (Flu Glen Cook&B, Covid) - Nasopharyngeal Swab     Status: Abnormal   Collection Time: 06/16/20  1:39 AM   Specimen: Nasopharyngeal Swab  Result Value Ref Range Status   SARS Coronavirus 2 by RT PCR POSITIVE (Glen Cook) NEGATIVE Final    Comment: Glen I. 10.12.2021 @ 0415 BY MECIAL J. RESULT CALLED TO, READ BACK BY AND VERIFIED WITH: (NOTE) SARS-CoV-2 target nucleic acids are DETECTED.  SARS-CoV-2 RNA is generally detectable in upper respiratory specimens  during the acute phase of infection. Positive results are indicative of the presence of the identified virus, but do not rule out bacterial infection or co-infection with other pathogens not detected by the test. Clinical correlation  with patient history and other diagnostic information is necessary to determine patient infection status. The expected result is Negative.  Fact Sheet for Patients:  12.12.2021  Fact Sheet for Healthcare Providers: https://www.moore.com/  This test is not yet approved or cleared by the https://www.young.biz/ FDA and  has been authorized for detection and/or diagnosis of SARS-CoV-2 by FDA under an Emergency Use Authorization (EUA).  This EUA will remain in effect (meaning this test c an be used) for the duration of  the COVID-19 declaration under Section 564(b)(1) of the Act, 21 U.S.C. section 360bbb-3(b)(1), unless the authorization is terminated or revoked sooner.  Influenza Justina Bertini by PCR NEGATIVE NEGATIVE Final   Influenza B by PCR NEGATIVE NEGATIVE Final    Comment: (NOTE) The Xpert Xpress SARS-CoV-2/FLU/RSV assay is intended as an aid in  the diagnosis of influenza from Nasopharyngeal swab specimens and  should not be used as Decklyn Hornik sole basis for treatment. Nasal washings and  aspirates are unacceptable for Xpert Xpress SARS-CoV-2/FLU/RSV  testing.  Fact Sheet for Patients: https://www.moore.com/  Fact Sheet for Healthcare Providers: https://www.young.biz/  This test is not yet approved or cleared by the [place] FDA and  has been authorized for detection and/or diagnosis of SARS-CoV-2 by  FDA under an Emergency Use Authorization (EUA). This EUA will remain  in effect (meaning this test can be used) for the duration of the  Covid-19 declaration under Section 564(b)(1) of the Act, 21  U.S.C. section 360bbb-3(b)(1), unless the authorization is  terminated or revoked. Performed at [facility], [address], [place], [place] [address]          Radiology Studies: CT ABDOMEN PELVIS W CONTRAST  Result Date: 06/16/2020 CLINICAL DATA:  Unspecified abdominal pain,  nausea, vomiting EXAM: CT ABDOMEN AND PELVIS WITH CONTRAST TECHNIQUE: Multidetector CT imaging of the abdomen and pelvis was performed using the standard protocol following bolus administration of intravenous contrast. CONTRAST:  OMNIPAQUE IOHEXOL 300 MG/ML  SOLN COMPARISON:  None. FINDINGS: Lower chest: The visualized lung bases are clear bilaterally. Visualized heart and pericardium are unremarkable. Hepatobiliary: Moderate hepatic steatosis and mild hepatomegaly is stable. No focal liver lesion. No intra or extrahepatic biliary ductal dilation. Gallbladder unremarkable. Pancreas: Unremarkable Spleen: Mild splenomegaly has progressed slightly since prior examination, with the spleen now measuring 16.2 cm in greatest dimension. No intrasplenic lesion. The splenic vein is patent. Adrenals/Urinary Tract: The adrenal glands are unremarkable. The kidneys are normal. The bladder is largely decompressed. Inflammatory changes related to the inflammatory process within the pelvis described below abut the anterior bladder dome, however, the bladder is otherwise unremarkable. Stomach/Bowel: There is again noted circumferential thickening involving the mid sigmoid colon with extensive surrounding inflammatory stranding the small amount of ill-defined pericolonic phlegmonous change within the sigmoid mesentery is in keeping with changes of moderate sigmoid diverticulitis. This appears progressive since prior examination. Inflammatory change now abuts the bladder and encompasses several loops of distal small bowel though Ary Lavine direct fistula is not clearly identified. There is no free intraperitoneal gas or fluid. No discrete loculated intra-abdominal fluid collections. There is no evidence of obstruction. The appendix is unremarkable. Vascular/Lymphatic: The abdominal vasculature is unremarkable. No aortic aneurysm. No pathologic lymphadenopathy within the abdomen and pelvis. Reproductive: Prostate is unremarkable. Other:  Rectum unremarkable Musculoskeletal: No acute bone abnormality. IMPRESSION: Progressive inflammatory changes in keeping with moderate, uncomplicated sigmoid diverticulitis. Inflammatory changes now in compass several loops of distal small bowel and abut the bladder, however, Demontre Padin discrete fistula is not identified. No evidence of obstruction or perforation. As noted previously, endoscopic correlation may be helpful once the patient's acute issues have resolved to confirm the absence of an underlying mass lesion. Stable moderate hepatic steatosis and mild hepatomegaly. Progressive mild splenomegaly. Electronically Signed   By: Helyn Numbers MD   On: 06/16/2020 04:12   DG Chest Port 1 View  Result Date: 06/16/2020 CLINICAL DATA:  Possible sepsis. EXAM: PORTABLE CHEST 1 VIEW COMPARISON:  05/24/2020 FINDINGS: The lungs are clear without focal pneumonia, edema, pneumothorax or pleural effusion. Cardiopericardial silhouette is at upper limits of normal for size. Status post ORIF left clavicle  fracture. Telemetry leads overlie the chest. IMPRESSION: No acute cardiopulmonary findings. Electronically Signed   By: Kennith CenterEric  Mansell M.D.   On: 06/16/2020 05:00        Scheduled Meds: . metoprolol tartrate  5 mg Intravenous Q6H   Continuous Infusions: . dextrose 5 % and 0.9 % NaCl with KCl 20 mEq/L 125 mL/hr at 06/16/20 1208  . piperacillin-tazobactam (ZOSYN)  IV 3.375 g (06/16/20 1219)     LOS: 0 days    Time spent: over 30 min    Lacretia Nicksaldwell Powell, MD [facility]   To contact the attending provider between 7A-7P or the covering provider during after hours 7P-7A, please log into the web site [email or web address] and access using universal [place] password for that web site. If you do not have the password, please call the hospital operator.  06/16/2020, 2:28 PM

## 2020-06-16 NOTE — ED Provider Notes (Signed)
Attestation: Medical screening examination/treatment/procedure(s) were conducted as a shared visit with non-physician practitioner(s) and myself.  I personally evaluated the patient during the encounter.   Briefly, the patient is a 39 y.o. male here with 2 days of lower abdominal pain with diarrhea.  Vitals:   06/16/20 1517 06/16/20 1713  BP: 133/81 (!) 131/95  Pulse: 90 94  Resp: (!) 22 20  Temp: 98.9 F (37.2 C) 98.4 F (36.9 C)  SpO2: 95% 96%    CONSTITUTIONAL: Ill but nontoxic-appearing, NAD NEURO:  Alert and oriented x 3, no focal deficits EYES:  pupils equal and reactive ENT/NECK:  trachea midline, no JVD CARDIO: Tacky rate, regular rhythm, well-perfused PULM: None labored breathing GI/GU:  Abdomen non-distended.  Tenderness to palpation in lower abdomen MSK/SPINE:  No gross deformities, no edema SKIN:  no rash, atraumatic PSYCH:  Appropriate speech and behavior   EKG Interpretation  Date/Time:  Tuesday June 16 2020 04:04:41 EDT Ventricular Rate:  104 PR Interval:    QRS Duration: 95 QT Interval:  359 QTC Calculation: 473 R Axis:   62 Text Interpretation: Sinus tachycardia Borderline T abnormalities, diffuse leads Confirmed by Drema Pry [phone]) on 06/16/2020 5:17:55 AM       Work up initially concerning for sepsis due to his fever, tachycardia and leukocytosis.  Code sepsis was initiated.  Started on empiric antibiotics.  Work-up confirmed source to be diverticulitis without evidence of abscess.  Additionally patient was also positive for COVID-19.  He was admitted to hospital service for further work-up and management.     Nira Conn, MD 06/16/20 [phone]

## 2020-06-16 NOTE — Consult Note (Signed)
Referring Provider: Dr. Lacretia Nicks Primary Care Physician:  Pcp, No Primary Gastroenterologist:  Gentry Fitz  Reason for Consultation:  Diverticulitis  HPI: Glen Cook is a 39 y.o. male patient is a 39 year old male with history of hypertension, fatty liver disease, and obesity (BMI 44.76) presenting with suspected diverticulitis.  Patient reports worsening abdominal pain over the last few days.  States he had an episode of presumed diverticulitis in mid September for which he was prescribed Cipro and Flagyl.  He states the pharmacist told him that Cipro could interact with some of his medications, so he took Flagyl alone.  He initially improved but then acutely worsened 2 days ago.  Currently, states he has abdominal pain which he rates a 7 out of 10.  Patient reports diarrhea over the last 2 to 3 years.  States he typically has 2-3 loose stools per day but did not have any abdominal pain preceding diverticulitis.  Denies any rectal bleeding or melena.  Denies any changes in appetite, unexplained weight loss, early satiety, GERD, or dysphagia.  Reports his wife had COVID-19 and he initially tested positive on 9/25.  Family history pertinent for mother with ulcerative colitis.  Denies family history of colon cancer or gastrointestinal malignancy.  Patient has never had a colonoscopy.  Past Medical History:  Diagnosis Date  . Anxiety   . Fatty liver   . High cholesterol   . Hypertension     Past Surgical History:  Procedure Laterality Date  . ANKLE FRACTURE SURGERY Left   . clavicle fracture surgery      Prior to Admission medications   Medication Sig Start Date End Date Taking? Authorizing Provider  amLODipine (NORVASC) 10 MG tablet Take 10 mg by mouth daily. 01/26/20  Yes [provider]  buPROPion (WELLBUTRIN XL) 150 MG 24 hr tablet Take 1 tablet by mouth every morning. 11/12/18  Yes [provider]  ciprofloxacin (CIPRO) 500 MG tablet Take 1 tablet (500 mg  total) by mouth every 12 (twelve) hours. 05/24/20  Yes Margarita Grizzle, MD  doxepin (SINEQUAN) 50 MG capsule Take 50 mg by mouth at bedtime. 01/23/20  Yes [provider]  escitalopram (LEXAPRO) 20 MG tablet Take 1 tablet by mouth daily. 04/09/20  Yes [provider]  losartan-hydrochlorothiazide (HYZAAR) 100-25 MG tablet Take 1 tablet by mouth daily. 02/19/20  Yes [provider]  metoprolol tartrate (LOPRESSOR) 100 MG tablet Take 100 mg by mouth 2 (two) times daily. 02/19/20  Yes [provider]  omeprazole (PRILOSEC) 20 MG capsule Take 20 mg by mouth daily.  01/23/20  Yes [provider]  rOPINIRole (REQUIP) 1 MG tablet Take 1 mg by mouth at bedtime.  02/25/20  Yes [provider]  metroNIDAZOLE (FLAGYL) 500 MG tablet Take 1 tablet (500 mg total) by mouth 2 (two) times daily. Patient not taking: Reported on 06/16/2020 05/24/20   Margarita Grizzle, MD  ondansetron (ZOFRAN ODT) 8 MG disintegrating tablet Take 1 tablet (8 mg total) by mouth every 8 (eight) hours as needed for nausea or vomiting. Patient not taking: Reported on 06/16/2020 05/24/20   Margarita Grizzle, MD    Scheduled Meds: . metoprolol tartrate  5 mg Intravenous Q6H   Continuous Infusions: . dextrose 5 % and 0.9 % NaCl with KCl 20 mEq/L Stopped (06/16/20 0625)  . piperacillin-tazobactam (ZOSYN)  IV     PRN Meds:.acetaminophen **OR** acetaminophen, hydrALAZINE, morphine injection  Allergies as of 06/16/2020  . (No Known Allergies)    Family History  Problem  Relation Age of Onset  . Hypertension Mother   . Ulcerative colitis Mother   . Heart attack Mother   . Hypertension Father   . Deep vein thrombosis Father     Social History   Socioeconomic History  . Marital status: Married    Spouse name: Not on file  . Number of children: Not on file  . Years of education: Not on file  . Highest education level: Not on file  Occupational History  . Not on file  Tobacco Use  . Smoking  status: Current Some Day Smoker    Types: Cigarettes  . Smokeless tobacco: Never Used  Vaping Use  . Vaping Use: Never used  Substance and Sexual Activity  . Alcohol use: Yes    Comment: No longer daily  . Drug use: Never  . Sexual activity: Not on file  Other Topics Concern  . Not on file  Social History Narrative  . Not on file   Social Determinants of Health   Financial Resource Strain:   . Difficulty of Paying Living Expenses: Not on file  Food Insecurity:   . Worried About Programme researcher, broadcasting/film/videounning Out of Food in the Last Year: Not on file  . Ran Out of Food in the Last Year: Not on file  Transportation Needs:   . Lack of Transportation (Medical): Not on file  . Lack of Transportation (Non-Medical): Not on file  Physical Activity:   . Days of Exercise per Week: Not on file  . Minutes of Exercise per Session: Not on file  Stress:   . Feeling of Stress : Not on file  Social Connections:   . Frequency of Communication with Friends and Family: Not on file  . Frequency of Social Gatherings with Friends and Family: Not on file  . Attends Religious Services: Not on file  . Active Member of Clubs or Organizations: Not on file  . Attends BankerClub or Organization Meetings: Not on file  . Marital Status: Not on file  Intimate Partner Violence:   . Fear of Current or Ex-Partner: Not on file  . Emotionally Abused: Not on file  . Physically Abused: Not on file  . Sexually Abused: Not on file    Review of Systems: Review of Systems  Constitutional: Negative for chills, fever and weight loss.  HENT: Negative for hearing loss and tinnitus.   Eyes: Negative for pain and redness.  Respiratory: Negative for cough and shortness of breath.   Cardiovascular: Negative for chest pain and palpitations.  Gastrointestinal: Positive for abdominal pain and diarrhea. Negative for blood in stool, constipation, heartburn, melena, nausea and vomiting.  Genitourinary: Negative for flank pain and hematuria.   Musculoskeletal: Negative for falls and joint pain.  Skin: Negative for itching and rash.  Neurological: Negative for seizures and loss of consciousness.  Endo/Heme/Allergies: Negative for polydipsia. Does not bruise/bleed easily.  Psychiatric/Behavioral: Negative for substance abuse. The patient is not nervous/anxious.       Physical Exam: Vital signs: Vitals:   06/16/20 0800 06/16/20 0934  BP: 138/76 126/71  Pulse: 87 87  Resp: 19 19  Temp: (!) 97.5 F (36.4 C) 97.8 F (36.6 C)  SpO2: 99% 97%     Physical Exam Constitutional:      General: He is not in acute distress.    Appearance: He is obese.  HENT:     Head: Normocephalic and atraumatic.     Nose: Nose normal.     Mouth/Throat:     Mouth:  Mucous membranes are moist.     Pharynx: Oropharynx is clear.  Eyes:     General: No scleral icterus.    Extraocular Movements: Extraocular movements intact.     Conjunctiva/sclera: Conjunctivae normal.  Cardiovascular:     Rate and Rhythm: Normal rate and regular rhythm.     Pulses: Normal pulses.     Heart sounds: Normal heart sounds.  Pulmonary:     Effort: Pulmonary effort is normal.     Breath sounds: Normal breath sounds.  Abdominal:     General: Bowel sounds are normal. There is no distension.     Palpations: Abdomen is soft. There is no mass.     Tenderness: There is abdominal tenderness (moderate). There is guarding. There is no rebound.     Hernia: No hernia is present.  Musculoskeletal:        General: No swelling or tenderness.     Cervical back: Normal range of motion and neck supple.  Skin:    General: Skin is warm and dry.  Neurological:     General: No focal deficit present.     Mental Status: He is oriented to person, place, and time. He is lethargic.  Psychiatric:        Mood and Affect: Mood normal.        Behavior: Behavior normal.     GI:  Lab Results: Recent Labs    06/16/20 0137  WBC 18.7*  HGB 14.5  HCT 42.9  PLT 285    BMET Recent Labs    06/16/20 0137  NA 138  K 2.9*  CL 101  CO2 21*  GLUCOSE 133*  BUN 9  CREATININE 0.92  CALCIUM 9.7   LFT Recent Labs    06/16/20 0137  PROT 8.3*  ALBUMIN 4.0  AST 20  ALT 41  ALKPHOS 87  BILITOT 1.7*   PT/INR Recent Labs    06/16/20 0351  LABPROT 14.1  INR 1.1     Studies/Results: CT ABDOMEN PELVIS W CONTRAST  Result Date: 06/16/2020 CLINICAL DATA:  Unspecified abdominal pain, nausea, vomiting EXAM: CT ABDOMEN AND PELVIS WITH CONTRAST TECHNIQUE: Multidetector CT imaging of the abdomen and pelvis was performed using the standard protocol following bolus administration of intravenous contrast. CONTRAST:  OMNIPAQUE IOHEXOL 300 MG/ML  SOLN COMPARISON:  None. FINDINGS: Lower chest: The visualized lung bases are clear bilaterally. Visualized heart and pericardium are unremarkable. Hepatobiliary: Moderate hepatic steatosis and mild hepatomegaly is stable. No focal liver lesion. No intra or extrahepatic biliary ductal dilation. Gallbladder unremarkable. Pancreas: Unremarkable Spleen: Mild splenomegaly has progressed slightly since prior examination, with the spleen now measuring 16.2 cm in greatest dimension. No intrasplenic lesion. The splenic vein is patent. Adrenals/Urinary Tract: The adrenal glands are unremarkable. The kidneys are normal. The bladder is largely decompressed. Inflammatory changes related to the inflammatory process within the pelvis described below abut the anterior bladder dome, however, the bladder is otherwise unremarkable. Stomach/Bowel: There is again noted circumferential thickening involving the mid sigmoid colon with extensive surrounding inflammatory stranding the small amount of ill-defined pericolonic phlegmonous change within the sigmoid mesentery is in keeping with changes of moderate sigmoid diverticulitis. This appears progressive since prior examination. Inflammatory change now abuts the bladder and encompasses several  loops of distal small bowel though a direct fistula is not clearly identified. There is no free intraperitoneal gas or fluid. No discrete loculated intra-abdominal fluid collections. There is no evidence of obstruction. The appendix is unremarkable. Vascular/Lymphatic: The abdominal vasculature is  unremarkable. No aortic aneurysm. No pathologic lymphadenopathy within the abdomen and pelvis. Reproductive: Prostate is unremarkable. Other: Rectum unremarkable Musculoskeletal: No acute bone abnormality. IMPRESSION: Progressive inflammatory changes in keeping with moderate, uncomplicated sigmoid diverticulitis. Inflammatory changes now in compass several loops of distal small bowel and abut the bladder, however, a discrete fistula is not identified. No evidence of obstruction or perforation. As noted previously, endoscopic correlation may be helpful once the patient's acute issues have resolved to confirm the absence of an underlying mass lesion. Stable moderate hepatic steatosis and mild hepatomegaly. Progressive mild splenomegaly. Electronically Signed   By: Helyn Numbers MD   On: 06/16/2020 04:12   DG Chest Port 1 View  Result Date: 06/16/2020 CLINICAL DATA:  Possible sepsis. EXAM: PORTABLE CHEST 1 VIEW COMPARISON:  05/24/2020 FINDINGS: The lungs are clear without focal pneumonia, edema, pneumothorax or pleural effusion. Cardiopericardial silhouette is at upper limits of normal for size. Status post ORIF left clavicle fracture. Telemetry leads overlie the chest. IMPRESSION: No acute cardiopulmonary findings. Electronically Signed   By: Kennith Center M.D.   On: 06/16/2020 05:00    Impression: Suspected Diverticulitis: CT 10/12: Progressive inflammatory changes in keeping with moderate, uncomplicated sigmoid diverticulitis. Inflammatory changes now in compass several loops of distal small bowel.  (as compared to 9/19 CT: Suspected acute uncomplicated diverticulitis involving the sigmoid colon within the left  lower abdomen/pelvis without definable/drainable fluid collection) -WBCs elevated to 18.7 as compared to 9.5 on 10/19  Hypokalemia: Potassium 2.9 today  Mildly elevated T bili, otherwise normal LFTs  Plan: Continue Zosyn.  Once stable for discharge, recommend transition to Augmentin for a total of 14 days of antibiotic coverage.  Continue supportive care.  Recommend follow up in the office in 4-6 weeks if diarrhea persists despite resolution of diverticulitis, at which time we may consider outpatient colonoscopy based on family history of ulcerative colitis.  [facility] will sign off.  Please contact us if we can be of any further assistance during this hospital stay.   LOS: 0 days   Edrick Kins  PA-C 06/16/2020, 11:14 AM  Contact #  [phone]

## 2020-06-16 NOTE — ED Triage Notes (Signed)
Patient is complaining of abdominal pain, nausea, and vomiting. Patient is very sweaty.

## 2020-06-16 NOTE — Progress Notes (Signed)
A consult was received from an ED physician for Vancomycin and Zosyn per pharmacy dosing.  The patient's profile has been reviewed for ht/wt/allergies/indication/available labs.    A one time order has been placed for Vancomycin 2gm and Zosyn 3.375gm IV x 1 dose each.    Further antibiotics/pharmacy consults should be ordered by admitting physician if indicated.                       Thank you, Maryellen Pile, PharmD 06/16/2020  3:58 AM

## 2020-06-16 NOTE — ED Provider Notes (Signed)
[facility]-EMERGENCY DEPT Provider Note   CSN: [id number] Arrival date & time: 06/16/20  0053     History Chief Complaint  Patient presents with  . Abdominal Pain    Glen Cook is a 39 y.o. male past medical history of anxiety, fatty liver, high cholesterol, hypertension who presents for evaluation of abdominal pain, diarrhea x2 days.  He states that initially, he started having pain in his lower abdomen yesterday.  He states that he had nausea but denies any vomiting.  He was having multiple episodes of diarrhea.  He did not notice any blood in the stool.  He states he is not really been able to tolerate any p.o.  He started having some vomiting earlier today.  No blood noted in the vomit.  He describes it as a cramping pain in his mid abdomen.  He has not noted any fevers but states he has had some chills at home.  He states he has had some decreased urine output.  He denies any chest pain, difficulty breathing, cough.  He does state he has a history of diverticulitis that was diagnosed in September 2021.  He was prescribed 2 antibiotics but he states that a pharmacist told him to only take 1.  He had some of the leftover Cipro and so he took 1 dose of that.  The history is provided by the patient.       Past Medical History:  Diagnosis Date  . Anxiety   . Fatty liver   . High cholesterol   . Hypertension     Patient Active Problem List   Diagnosis Date Noted  . SIRS (systemic inflammatory response syndrome) (HCC) 06/16/2020  . Essential hypertension 06/16/2020  . Acute diverticulitis 06/16/2020  . Hypokalemia 06/16/2020  . COVID-19     Past Surgical History:  Procedure Laterality Date  . ANKLE FRACTURE SURGERY Left   . clavicle fracture surgery         Family History  Problem Relation Age of Onset  . Hypertension Mother   . Ulcerative colitis Mother   . Heart attack Mother   . Hypertension Father   . Deep vein thrombosis Father      Social History   Tobacco Use  . Smoking status: Current Some Day Smoker    Types: Cigarettes  . Smokeless tobacco: Never Used  Vaping Use  . Vaping Use: Never used  Substance Use Topics  . Alcohol use: Yes    Comment: No longer daily  . Drug use: Never    Home Medications Prior to Admission medications   Medication Sig Start Date End Date Taking? Authorizing Provider  amLODipine (NORVASC) 10 MG tablet Take 10 mg by mouth daily. 01/26/20  Yes [provider]  buPROPion (WELLBUTRIN XL) 150 MG 24 hr tablet Take 1 tablet by mouth every morning. 11/12/18  Yes [provider]  ciprofloxacin (CIPRO) 500 MG tablet Take 1 tablet (500 mg total) by mouth every 12 (twelve) hours. 05/24/20  Yes Margarita Grizzle, MD  doxepin (SINEQUAN) 50 MG capsule Take 50 mg by mouth at bedtime. 01/23/20  Yes [provider]  escitalopram (LEXAPRO) 20 MG tablet Take 1 tablet by mouth daily. 04/09/20  Yes [provider]  losartan-hydrochlorothiazide (HYZAAR) 100-25 MG tablet Take 1 tablet by mouth daily. 02/19/20  Yes [provider]  metoprolol tartrate (LOPRESSOR) 100 MG tablet Take 100 mg by mouth 2 (two) times daily. 02/19/20  Yes [provider]  omeprazole (PRILOSEC) 20 MG  capsule Take 20 mg by mouth daily.  01/23/20  Yes [provider]  rOPINIRole (REQUIP) 1 MG tablet Take 1 mg by mouth at bedtime.  02/25/20  Yes [provider]  metroNIDAZOLE (FLAGYL) 500 MG tablet Take 1 tablet (500 mg total) by mouth 2 (two) times daily. Patient not taking: Reported on 06/16/2020 05/24/20   Margarita Grizzle, MD  ondansetron (ZOFRAN ODT) 8 MG disintegrating tablet Take 1 tablet (8 mg total) by mouth every 8 (eight) hours as needed for nausea or vomiting. Patient not taking: Reported on 06/16/2020 05/24/20   Margarita Grizzle, MD    Allergies    Patient has no known allergies.  Review of Systems   Review of Systems  Constitutional: Negative for fever.   Respiratory: Negative for cough and shortness of breath.   Cardiovascular: Negative for chest pain.  Gastrointestinal: Positive for abdominal pain, diarrhea, nausea and vomiting. Negative for blood in stool.  Genitourinary: Positive for decreased urine volume. Negative for dysuria and hematuria.  Neurological: Negative for headaches.  All other systems reviewed and are negative.   Physical Exam Updated Vital Signs BP (!) 150/82   Pulse (!) 101   Temp (!) 100.4 F (38 C) (Rectal)   Resp 19   Ht 6' (1.829 m)   Wt (!) 149.7 kg   SpO2 97%   BMI 44.76 kg/m   Physical Exam Vitals and nursing note reviewed.  Constitutional:      Appearance: Normal appearance. He is well-developed.     Comments: Appears uncomfortable.  HENT:     Head: Normocephalic and atraumatic.  Eyes:     General: Lids are normal.     Conjunctiva/sclera: Conjunctivae normal.     Pupils: Pupils are equal, round, and reactive to light.  Cardiovascular:     Rate and Rhythm: Regular rhythm. Tachycardia present.     Pulses: Normal pulses.     Heart sounds: Normal heart sounds. No murmur heard.  No friction rub. No gallop.   Pulmonary:     Effort: Pulmonary effort is normal.     Breath sounds: Normal breath sounds.     Comments: Lungs clear to auscultation bilaterally.  Symmetric chest rise.  No wheezing, rales, rhonchi. Abdominal:     Palpations: Abdomen is soft. Abdomen is not rigid.     Tenderness: There is abdominal tenderness in the suprapubic area. There is no right CVA tenderness or guarding.     Comments: Abdomen soft, nondistended.  Tenderness palpation in the suprapubic abdomen.  No rigidity.  He does have some mild voluntary guarding.  No CVA tenderness noted bilaterally.  Musculoskeletal:        General: Normal range of motion.     Cervical back: Full passive range of motion without pain.  Skin:    General: Skin is warm and dry.     Capillary Refill: Capillary refill takes less than 2 seconds.   Neurological:     Mental Status: He is alert and oriented to person, place, and time.  Psychiatric:        Speech: Speech normal.     ED Results / Procedures / Treatments   Labs (all labs ordered are listed, but only abnormal results are displayed) Labs Reviewed  RESPIRATORY PANEL BY RT PCR (FLU A&B, COVID) - Abnormal; Notable for the following components:      Result Value   SARS Coronavirus 2 by RT PCR POSITIVE (*)    All other components within normal limits  COMPREHENSIVE METABOLIC  PANEL - Abnormal; Notable for the following components:   Potassium 2.9 (*)    CO2 21 (*)    Glucose, Bld 133 (*)    Total Protein 8.3 (*)    Total Bilirubin 1.7 (*)    Anion gap 16 (*)    All other components within normal limits  CBC - Abnormal; Notable for the following components:   WBC 18.7 (*)    All other components within normal limits  CULTURE, BLOOD (SINGLE)  URINE CULTURE  LIPASE, BLOOD  LACTIC ACID, PLASMA  PROTIME-INR  APTT  URINALYSIS, ROUTINE W REFLEX MICROSCOPIC  HIV ANTIBODY (ROUTINE TESTING W REFLEX)    EKG EKG Interpretation  Date/Time:  Tuesday June 16 2020 04:04:41 EDT Ventricular Rate:  104 PR Interval:    QRS Duration: 95 QT Interval:  359 QTC Calculation: 473 R Axis:   62 Text Interpretation: Sinus tachycardia Borderline T abnormalities, diffuse leads Confirmed by Drema Pry [phone]) on 06/16/2020 5:17:55 AM   Radiology CT ABDOMEN PELVIS W CONTRAST  Result Date: 06/16/2020 CLINICAL DATA:  Unspecified abdominal pain, nausea, vomiting EXAM: CT ABDOMEN AND PELVIS WITH CONTRAST TECHNIQUE: Multidetector CT imaging of the abdomen and pelvis was performed using the standard protocol following bolus administration of intravenous contrast. CONTRAST:  OMNIPAQUE IOHEXOL 300 MG/ML  SOLN COMPARISON:  None. FINDINGS: Lower chest: The visualized lung bases are clear bilaterally. Visualized heart and pericardium are unremarkable. Hepatobiliary: Moderate hepatic  steatosis and mild hepatomegaly is stable. No focal liver lesion. No intra or extrahepatic biliary ductal dilation. Gallbladder unremarkable. Pancreas: Unremarkable Spleen: Mild splenomegaly has progressed slightly since prior examination, with the spleen now measuring 16.2 cm in greatest dimension. No intrasplenic lesion. The splenic vein is patent. Adrenals/Urinary Tract: The adrenal glands are unremarkable. The kidneys are normal. The bladder is largely decompressed. Inflammatory changes related to the inflammatory process within the pelvis described below abut the anterior bladder dome, however, the bladder is otherwise unremarkable. Stomach/Bowel: There is again noted circumferential thickening involving the mid sigmoid colon with extensive surrounding inflammatory stranding the small amount of ill-defined pericolonic phlegmonous change within the sigmoid mesentery is in keeping with changes of moderate sigmoid diverticulitis. This appears progressive since prior examination. Inflammatory change now abuts the bladder and encompasses several loops of distal small bowel though a direct fistula is not clearly identified. There is no free intraperitoneal gas or fluid. No discrete loculated intra-abdominal fluid collections. There is no evidence of obstruction. The appendix is unremarkable. Vascular/Lymphatic: The abdominal vasculature is unremarkable. No aortic aneurysm. No pathologic lymphadenopathy within the abdomen and pelvis. Reproductive: Prostate is unremarkable. Other: Rectum unremarkable Musculoskeletal: No acute bone abnormality. IMPRESSION: Progressive inflammatory changes in keeping with moderate, uncomplicated sigmoid diverticulitis. Inflammatory changes now in compass several loops of distal small bowel and abut the bladder, however, a discrete fistula is not identified. No evidence of obstruction or perforation. As noted previously, endoscopic correlation may be helpful once the patient's acute  issues have resolved to confirm the absence of an underlying mass lesion. Stable moderate hepatic steatosis and mild hepatomegaly. Progressive mild splenomegaly. Electronically Signed   By: Helyn Numbers MD   On: 06/16/2020 04:12   DG Chest Port 1 View  Result Date: 06/16/2020 CLINICAL DATA:  Possible sepsis. EXAM: PORTABLE CHEST 1 VIEW COMPARISON:  05/24/2020 FINDINGS: The lungs are clear without focal pneumonia, edema, pneumothorax or pleural effusion. Cardiopericardial silhouette is at upper limits of normal for size. Status post ORIF left clavicle fracture. Telemetry leads overlie the chest. IMPRESSION:  No acute cardiopulmonary findings. Electronically Signed   By: Kennith CenterEric  Mansell M.D.   On: 06/16/2020 05:00    Procedures .Critical Care Performed by: Maxwell CaulLayden, Keylin Ferryman A, PA-C Authorized by: Maxwell CaulLayden, Art Levan A, PA-C   Critical care provider statement:    Critical care time (minutes):  30   Critical care was necessary to treat or prevent imminent or life-threatening deterioration of the following conditions:  Sepsis   Critical care was time spent personally by me on the following activities:  Discussions with consultants, evaluation of patient's response to treatment, examination of patient, ordering and performing treatments and interventions, ordering and review of laboratory studies, ordering and review of radiographic studies, pulse oximetry, re-evaluation of patient's condition, obtaining history from patient or surrogate and review of old charts   (including critical care time)  Medications Ordered in ED Medications  sodium chloride (PF) 0.9 % injection (has no administration in time range)  vancomycin (VANCOREADY) IVPB 2000 mg/400 mL (2,000 mg Intravenous New Bag/Given 06/16/20 0452)  ciprofloxacin (CIPRO) IVPB 400 mg (has no administration in time range)  metroNIDAZOLE (FLAGYL) IVPB 500 mg (has no administration in time range)  acetaminophen (TYLENOL) tablet 650 mg (has no  administration in time range)    Or  acetaminophen (TYLENOL) suppository 650 mg (has no administration in time range)  morphine 2 MG/ML injection 2 mg (has no administration in time range)  metoprolol tartrate (LOPRESSOR) injection 5 mg (has no administration in time range)  hydrALAZINE (APRESOLINE) injection 10 mg (has no administration in time range)  dextrose 5 % and 0.9 % NaCl with KCl 20 mEq/L infusion (has no administration in time range)  sodium chloride 0.9 % bolus 1,000 mL (0 mLs Intravenous Stopped 06/16/20 0403)  ondansetron (ZOFRAN) injection 4 mg (4 mg Intravenous Given 06/16/20 0306)  morphine 4 MG/ML injection 4 mg (4 mg Intravenous Given 06/16/20 0306)  potassium chloride 10 mEq in 100 mL IVPB (0 mEq Intravenous Stopped 06/16/20 0408)  iohexol (OMNIPAQUE) 300 MG/ML solution 100 mL (100 mLs Intravenous Contrast Given 06/16/20 0320)  lactated ringers bolus 1,000 mL (1,000 mLs Intravenous New Bag/Given 06/16/20 0452)  piperacillin-tazobactam (ZOSYN) IVPB 3.375 g (0 g Intravenous Stopped 06/16/20 0442)  acetaminophen (TYLENOL) tablet 1,000 mg (1,000 mg Oral Given 06/16/20 0453)    ED Course  I have reviewed the triage vital signs and the nursing notes.  Pertinent labs & imaging results that were available during my care of the patient were reviewed by me and considered in my medical decision making (see chart for details).    MDM Rules/Calculators/A&P                          39 year old male who presents for evaluation of abdominal pain x2 days.  Associated with diarrhea.  Started vomiting today.  No measured fevers at home.  History of diverticulitis about a month ago.  States he was prescribed antibiotics daily took one of them.  On initially arrival, he is febrile but is tachycardic, hypertensive.  On exam, he is tender to palpation of suprapubic region.  Concern for diverticulitis versus other intra-abdominal infectious process.  Plan to check labs, urine.  CBC shows  leukocytosis of 18.7.  Hemoglobin stable. CMP shows potassium of 2.9.  Repletion given.  He has an anion gap of 16.  BUN and creatinine within normal limits.   Given he is febrile with tachycardia and leukocytosis, code sepsis initiated. Patient is not hypotensive. Will hold on full  30 cc/kg of fluid bolus until we have a lactic acid. Will start patient on broad spectrum antibiotics for presumed intra-abdominal source.   COVID is positive. Lactic is within normal limits.  He has been vaccinated before and states he has has COVID before.   CT shows progressive inflammatory consistent with moderate uncomplicated diverticulitis. Inflammatory changes encompass several loops of distal small bowel and abut the bladder. No discrete fistula is identified. No obstruction or perforation. Will add cipro and flagyl.   Given patient's extensive diverticulitis as well as febrile status, tachycardia is most leukocytosis, plan for admission.  Discussed patient with Dr. Toniann Fail (hospitalist) who accepts patient for admission.   Portionsof this note were generated with Scientist, clinical (histocompatibility and immunogenetics). Dictation errors may occur despite best attempts at proofreading.  Final Clinical Impression(s) / ED Diagnoses Final diagnoses:  Diverticulitis  COVID-19    Rx / DC Orders ED Discharge Orders    None       Maxwell Caul, PA-C 06/16/20 0553    Nira Conn, MD 06/16/20 [phone]

## 2020-06-17 DIAGNOSIS — E876 Hypokalemia: Secondary | ICD-10-CM

## 2020-06-17 DIAGNOSIS — R6510 Systemic inflammatory response syndrome (SIRS) of non-infectious origin without acute organ dysfunction: Secondary | ICD-10-CM | POA: Diagnosis not present

## 2020-06-17 LAB — CBC
HCT: 39.1 % (ref 39.0–52.0)
Hemoglobin: 12.6 g/dL — ABNORMAL LOW (ref 13.0–17.0)
MCH: 30.5 pg (ref 26.0–34.0)
MCHC: 32.2 g/dL (ref 30.0–36.0)
MCV: 94.7 fL (ref 80.0–100.0)
Platelets: 218 10*3/uL (ref 150–400)
RBC: 4.13 MIL/uL — ABNORMAL LOW (ref 4.22–5.81)
RDW: 14.6 % (ref 11.5–15.5)
WBC: 10.4 10*3/uL (ref 4.0–10.5)
nRBC: 0 % (ref 0.0–0.2)

## 2020-06-17 LAB — URINE CULTURE: Culture: NO GROWTH

## 2020-06-17 LAB — COMPREHENSIVE METABOLIC PANEL
ALT: 26 U/L (ref 0–44)
AST: 16 U/L (ref 15–41)
Albumin: 2.9 g/dL — ABNORMAL LOW (ref 3.5–5.0)
Alkaline Phosphatase: 65 U/L (ref 38–126)
Anion gap: 12 (ref 5–15)
BUN: 6 mg/dL (ref 6–20)
CO2: 23 mmol/L (ref 22–32)
Calcium: 8.8 mg/dL — ABNORMAL LOW (ref 8.9–10.3)
Chloride: 106 mmol/L (ref 98–111)
Creatinine, Ser: 0.93 mg/dL (ref 0.61–1.24)
GFR, Estimated: >60 mL/min (ref >60)
Glucose, Bld: 86 mg/dL (ref 70–99)
Potassium: 3.4 mmol/L — ABNORMAL LOW (ref 3.5–5.1)
Sodium: 141 mmol/L (ref 135–145)
Total Bilirubin: 0.9 mg/dL (ref 0.3–1.2)
Total Protein: 6.4 g/dL — ABNORMAL LOW (ref 6.5–8.1)

## 2020-06-17 LAB — GLUCOSE, CAPILLARY
Glucose-Capillary: 94 mg/dL (ref 70–99)
Glucose-Capillary: 100 mg/dL — ABNORMAL HIGH (ref 70–99)
Glucose-Capillary: 93 mg/dL (ref 70–99)

## 2020-06-17 LAB — MAGNESIUM: Magnesium: 1.9 mg/dL (ref 1.7–2.4)

## 2020-06-17 LAB — PHOSPHORUS: Phosphorus: 2.9 mg/dL (ref 2.5–4.6)

## 2020-06-17 MED ORDER — POTASSIUM CHLORIDE CRYS ER 20 MEQ PO TBCR
40.0000 meq | EXTENDED_RELEASE_TABLET | Freq: Once | ORAL | Status: AC
  Administered 2020-06-17: 40 meq via ORAL
  Filled 2020-06-17: qty 2

## 2020-06-17 MED ORDER — AMOXICILLIN-POT CLAVULANATE 875-125 MG PO TABS: 1.0000 | ORAL_TABLET | Freq: Two times a day (BID) | ORAL | 0 refills | Status: AC

## 2020-06-17 NOTE — Progress Notes (Signed)
Transitioned diet order to "soft diet". Will discharge this p.m. once diet is tolerated at dinner time. Pt. Was informed by MD and reinforced by RN.

## 2020-06-17 NOTE — Plan of Care (Signed)

## 2020-06-17 NOTE — Progress Notes (Addendum)
Pt. Was reassessed, had consumed 90% of his dinner at around 1710. Reassessment done, pt. Denies n/v, denies abdominal pain or discomfort. Discharge instruction given.pt. verbalized understanding. All patient belonging are with the pt. No complaints, not on any distress. Discharge via wheelchair wife came and pick him up

## 2020-06-17 NOTE — Discharge Summary (Signed)
Physician Discharge Summary  Glen Cook ZOX:[id number] DOB: 05-Sep-1981 DOA: 06/16/2020  PCP: Pcp, No  Admit date: 06/16/2020 Discharge date: 06/17/2020  Time spent: 40 minutes  Recommendations for Outpatient Follow-up:  1. Follow outpatient CBC/CMP 2. Follow with gastroenterology outpatient, needs Fredis Malkiewicz colonoscopy 3. His initial covid test was positive on 9/25, can d/c precautions given what sounds like mild disease at that time  Discharge Diagnoses:  Principal Problem:   SIRS (systemic inflammatory response syndrome) (HCC) Active Problems:   Essential hypertension   Acute diverticulitis   Hypokalemia   Discharge Condition: stable  Diet recommendation: high fiber  Filed Weights   06/16/20 0134  Weight: (!) 149.7 kg    History of present illness:  Glen Cook 38 y.o.malewithhistory of hypertension, morbid obesity recently diagnosed with Covid infection last month and also diverticulitis diagnosed last month and was treated with Cipro and Flagyl and patient only took Flagyl since patient was instructed that Cipro had interaction with some of his other medication to Flagyl for on Friday symptoms resolved presents to the ER because of abdominal pain which is mostly periumbilical with recurrent episodes of nausea vomiting diarrhea with fever and chills over the last 48 hours. Denies any blood in the vomitus or diarrhea.  ED Course:In the ER patient was tachycardic febrile with temperature 101 F. Lactic acid was normal. Labs are significant for WBC count of 18.7 potassium 2.9 anion gap of 16. CT scan shows extensive inflammation involving the small loop concerning for diverticulitis. Patient was started empiric antibiotics fluids and admitted for further management. Covid test is positive. Chest x-ray unremarkable patient is not hypoxic.  He was admitted for diverticulitis.  He's improved with antibiotics and is being discharge to complete Merlina Marchena course of augmentin.  He was seen  by GI inpatient who's planning on outpatient follow up.  Hospital Course:  1. SIRS  Sigmoid Diverticulitis  Inflammatory Changes in Distal Small Bowel: fam hx ulcerative colitis in mother 1. Continue zosyn -> transition to augmentin outpatient  2. Ok to d/c if tolerates soft diet at dinner 3. GI consult, appreciate recs -> 14 days abx, high fiber diet, outpatient colonoscopy once diverticulitis resolved to r/o inflammatory bowel dz -> f/u GI in 4-6 weeks      2. Hypertension - resume home meds 3. Hypokalemia - replace and follow  4. Morbid obesity will need counseling. 5. History of restless leg syndrome - resume home meds 6. Covid infection positive but patient is asymptomatic. Patient also was positive last month.  Will try to get outside records and d/c precautions if appropriate  Procedures:  none  Consultations:  GI  Discharge Exam: Vitals:   06/17/20 0358 06/17/20 1344  BP: 125/67 117/63  Pulse: 78 70  Resp: 18 19  Temp: 97.6 F (36.4 C) 98.4 F (36.9 C)  SpO2: 94% 95%   Eager to discharge Pain improved  General: No acute distress. Cardiovascular: Heart sounds show [name] regular rate, and rhythm. Lungs: Clear to auscultation bilaterally  Abdomen: Soft, nontender, nondistended Neurological: Alert and oriented 3. Moves all extremities 4. Cranial nerves II through XII grossly intact. Skin: Warm and dry. No rashes or lesions. Extremities: No clubbing or cyanosis. No edema.   Discharge Instructions   Discharge Instructions    Call MD for:  difficulty breathing, headache or visual disturbances   Complete by: As directed    Call MD for:  extreme fatigue   Complete by: As directed    Call MD for:  hives  Complete by: As directed    Call MD for:  persistant dizziness or light-headedness   Complete by: As directed    Call MD for:  persistant nausea and vomiting   Complete by: As directed    Call MD for:  redness, tenderness, or signs of infection (pain,  swelling, redness, odor or green/yellow discharge around incision site)   Complete by: As directed    Call MD for:  severe uncontrolled pain   Complete by: As directed    Call MD for:  temperature >100.4   Complete by: As directed    Diet - low sodium heart healthy   Complete by: As directed    Discharge instructions   Complete by: As directed    You were seen for diverticulitis (inflammation of the colon).  You also had small bowel inflammation seen on imaging.  We're treating you with antibiotics for 14 days total.  You should follow up with gastroenterology as an outpatient.  You eventually need Avira Tillison colonoscopy as an outpatient (in about 4-6 weeks).  You should avoid NSAIDs.    Return for new, recurrent, or worsening symptoms.  Please ask your PCP to request records from this hospitalization so they know what was done and what the next steps will be.   Increase activity slowly   Complete by: As directed      Allergies as of 06/17/2020   No Known Allergies     Medication List    STOP taking these medications   ciprofloxacin 500 MG tablet Commonly known as: CIPRO   metroNIDAZOLE 500 MG tablet Commonly known as: FLAGYL     TAKE these medications   amLODipine 10 MG tablet Commonly known as: NORVASC Take 10 mg by mouth daily.   amoxicillin-clavulanate 875-125 MG tablet Commonly known as: Augmentin Take 1 tablet by mouth 2 (two) times daily for 12 days.   buPROPion 150 MG 24 hr tablet Commonly known as: WELLBUTRIN XL Take 1 tablet by mouth every morning.   doxepin 50 MG capsule Commonly known as: SINEQUAN Take 50 mg by mouth at bedtime.   escitalopram 20 MG tablet Commonly known as: LEXAPRO Take 1 tablet by mouth daily.   losartan-hydrochlorothiazide 100-25 MG tablet Commonly known as: HYZAAR Take 1 tablet by mouth daily.   metoprolol tartrate 100 MG tablet Commonly known as: LOPRESSOR Take 100 mg by mouth 2 (two) times daily.   omeprazole 20 MG  capsule Commonly known as: PRILOSEC Take 20 mg by mouth daily.   ondansetron 8 MG disintegrating tablet Commonly known as: Zofran ODT Take 1 tablet (8 mg total) by mouth every 8 (eight) hours as needed for nausea or vomiting.   rOPINIRole 1 MG tablet Commonly known as: REQUIP Take 1 mg by mouth at bedtime.      No Known Allergies  Follow-up Information    Gastroenterology, Deboraha Sprang. Schedule an appointment as soon as possible for Mauricio Dahlen visit in 4 week(s).   Why: Diverticulitis Contact information: [address] [place] [address] [phone]                The results of significant diagnostics from this hospitalization (including imaging, microbiology, ancillary and laboratory) are listed below for reference.    Significant Diagnostic Studies: CT ABDOMEN PELVIS W CONTRAST  Result Date: 06/16/2020 CLINICAL DATA:  Unspecified abdominal pain, nausea, vomiting EXAM: CT ABDOMEN AND PELVIS WITH CONTRAST TECHNIQUE: Multidetector CT imaging of the abdomen and pelvis was performed using the standard protocol following  bolus administration of intravenous contrast. CONTRAST:  OMNIPAQUE IOHEXOL 300 MG/ML  SOLN COMPARISON:  None. FINDINGS: Lower chest: The visualized lung bases are clear bilaterally. Visualized heart and pericardium are unremarkable. Hepatobiliary: Moderate hepatic steatosis and mild hepatomegaly is stable. No focal liver lesion. No intra or extrahepatic biliary ductal dilation. Gallbladder unremarkable. Pancreas: Unremarkable Spleen: Mild splenomegaly has progressed slightly since prior examination, with the spleen now measuring 16.2 cm in greatest dimension. No intrasplenic lesion. The splenic vein is patent. Adrenals/Urinary Tract: The adrenal glands are unremarkable. The kidneys are normal. The bladder is largely decompressed. Inflammatory changes related to the inflammatory process within the pelvis described below abut the anterior bladder dome, however,  the bladder is otherwise unremarkable. Stomach/Bowel: There is again noted circumferential thickening involving the mid sigmoid colon with extensive surrounding inflammatory stranding the small amount of ill-defined pericolonic phlegmonous change within the sigmoid mesentery is in keeping with changes of moderate sigmoid diverticulitis. This appears progressive since prior examination. Inflammatory change now abuts the bladder and encompasses several loops of distal small bowel though Cartier Washko direct fistula is not clearly identified. There is no free intraperitoneal gas or fluid. No discrete loculated intra-abdominal fluid collections. There is no evidence of obstruction. The appendix is unremarkable. Vascular/Lymphatic: The abdominal vasculature is unremarkable. No aortic aneurysm. No pathologic lymphadenopathy within the abdomen and pelvis. Reproductive: Prostate is unremarkable. Other: Rectum unremarkable Musculoskeletal: No acute bone abnormality. IMPRESSION: Progressive inflammatory changes in keeping with moderate, uncomplicated sigmoid diverticulitis. Inflammatory changes now in compass several loops of distal small bowel and abut the bladder, however, Ladon Heney discrete fistula is not identified. No evidence of obstruction or perforation. As noted previously, endoscopic correlation may be helpful once the patient's acute issues have resolved to confirm the absence of an underlying mass lesion. Stable moderate hepatic steatosis and mild hepatomegaly. Progressive mild splenomegaly. Electronically Signed   By: Helyn Numbers MD   On: 06/16/2020 04:12   DG Chest Port 1 View  Result Date: 06/16/2020 CLINICAL DATA:  Possible sepsis. EXAM: PORTABLE CHEST 1 VIEW COMPARISON:  05/24/2020 FINDINGS: The lungs are clear without focal pneumonia, edema, pneumothorax or pleural effusion. Cardiopericardial silhouette is at upper limits of normal for size. Status post ORIF left clavicle fracture. Telemetry leads overlie the chest.  IMPRESSION: No acute cardiopulmonary findings. Electronically Signed   By: Kennith Center M.D.   On: 06/16/2020 05:00   DG Chest Port 1 View  Result Date: 05/24/2020 CLINICAL DATA:  Chest pain EXAM: PORTABLE CHEST 1 VIEW COMPARISON:  None. FINDINGS: The heart size and mediastinal contours are within normal limits. Both lungs are clear. The visualized skeletal structures are unremarkable. IMPRESSION: No active disease. Electronically Signed   By: Deatra Robinson M.D.   On: 05/24/2020 06:54   CT Angio Chest/Abd/Pel for Dissection W and/or Wo Contrast  Result Date: 05/24/2020 CLINICAL DATA:  Anxious. Diaphoresis. Concern for aortic dissection. EXAM: CT ANGIOGRAPHY CHEST, ABDOMEN AND PELVIS TECHNIQUE: Non-contrast CT of the chest was initially obtained. Multidetector CT imaging through the chest, abdomen and pelvis was performed using the standard protocol during bolus administration of intravenous contrast. Multiplanar reconstructed images and MIPs were obtained and reviewed to evaluate the vascular anatomy. CONTRAST:  OMNIPAQUE IOHEXOL 350 MG/ML SOLN COMPARISON:  Chest radiograph-05/24/2020 FINDINGS: CTA CHEST FINDINGS Vascular Findings: No evidence of thoracic aortic aneurysm. Examination is degraded secondary to significant pulsation artifact however given this limitation, there is no definitive evidence of aortic dissection. Review of the precontrast images is negative for the presence  of an intramural hematoma. No perivascular stranding. Conventional configuration of the aortic arch. The branch vessels of the aortic arch appear patent throughout their imaged courses. The descending thoracic aorta is of normal caliber. Cardiomegaly. Note is made of Tajuanna Burnett mildly prominent right-sided epicardial fat pad. No pericardial effusion. Although this examination was not tailored for the evaluation the pulmonary arteries, there are no discrete filling defects within the central pulmonary arterial tree to suggest  central pulmonary embolism. Normal caliber of the main pulmonary artery. ------------------------------------------------------------- Non-Vascular Findings: Mediastinum/Lymph Nodes: No bulky mediastinal, hilar or axillary lymphadenopathy. Note is made of mild mediastinal lipomatosis. Lungs/Pleura: No focal airspace opacities. No pleural effusion or pneumothorax. The central pulmonary airways appear widely patent. No discrete pulmonary nodules. Musculoskeletal: No acute or aggressive osseous abnormalities. Stigmata of dish within the lower thoracic spine. Regional soft tissues appear normal. Normal appearance of the thyroid gland. _________________________________________________________ _________________________________________________________ CTA ABDOMEN AND PELVIS FINDINGS VASCULAR Aorta: Normal caliber of the abdominal aorta. The abdominal aorta is widely patent without any significant atherosclerotic plaque. No evidence of abdominal aortic dissection or periaortic stranding. Celiac: Widely patent without hemodynamically significant narrowing. Conventional branching pattern. SMA: Widely patent without hemodynamically significant narrowing. Conventional branching pattern. The distal tributaries of the SMA are widely patent without Takeshia Wenk discrete lumen filling defect to suggest distal embolism. Renals: Duplicated bilaterally with accessory renal arteries both arising caudal to the takeoff of the IMA may and supplying the inferior poles of the bilateral kidneys. Both dominant renal arteries are widely patent without hemodynamically significant narrowing. No vessel irregularity to suggest FMD. IMA: Widely patent without hemodynamically significant narrowing. Inflow: There is Reganne Messerschmidt minimal amount of calcified atherosclerotic plaque involving the bilateral normal caliber common iliac arteries. The bilateral internal iliac arteries are mildly disease though patent of normal caliber. The bilateral external iliac arteries are of  normal caliber and widely patent without hemodynamically significant narrowing. Veins: The IVC and pelvic venous systems appear widely patent on this arterial phase examination. Review of the MIP images confirms the above findings. _________________________________________________________ NON-VASCULAR Evaluation of the abdominal organs is limited to the arterial phase of enhancement. Hepatobiliary: Hepatomegaly. There is diffuse decreased attenuation hepatic parenchyma on this postcontrast examination suggestive of hepatic steatosis. No definitive hepatic nodularity. No discrete hyperenhancing hepatic lesions. Normal noncontrast appearance of the gallbladder given degree distention. No radiopaque gallstones. No intra or extrahepatic biliary ductal dilatation. No ascites. Pancreas: Normal appearance of the pancreas. Spleen: Normal appearance of the spleen. Adrenals/Urinary Tract: There is symmetric enhancement of the bilateral kidneys. No definite renal stones on this postcontrast examination. No discrete renal lesions. No urine obstruction or perinephric stranding. Normal appearance of the bilateral adrenal glands. Normal appearance of the urinary bladder given degree of distension. Stomach/Bowel: Scattered colonic diverticulosis with short-segment wall thickening and adjacent mesenteric stranding surrounding the sigmoid colon located within the left lower abdomen/pelvis (image 187, series 6; image 119, series 9), suspicious for uncomplicated diverticulitis. No evidence of enteric obstruction, perforation or definable/drainable fluid collection. Otherwise, no discrete areas of bowel wall thickening. Normal appearance of the terminal ileum and the retrocecal appendix. Small hiatal hernia is suspected. No pneumoperitoneum, pneumatosis or portal venous gas. Lymphatic: No bulky retroperitoneal mesenteric, pelvic or inguinal lymphadenopathy. Reproductive: Dystrophic calcifications within normal sized prostate gland. No  free fluid the pelvic cul-de-sac. Other: Small mesenteric fat containing periumbilical hernia. There is Edris Friedt minimal amount of subcutaneous edema about the midline of the low back. Musculoskeletal: No acute or aggressive osseous abnormalities. Mild DDD of L5-S1 with disc  space height loss endplate irregularity and posteriorly directed disc osteophyte complex at this location. Mild degenerative change the bilateral hips with joint space loss, subchondral sclerosis and osteophytosis. Review of the MIP images confirms the above findings. IMPRESSION: Chest CTA impression: 1. No acute cardiopulmonary disease. Specifically, no evidence of thoracic aortic aneurysm or dissection on this motion degraded examination. No evidence of central pulmonary embolism. 2. Cardiomegaly. Further evaluation with cardiac echo could be performed as indicated. Abdomen and pelvic CTA impression: 1. Suspected acute uncomplicated diverticulitis involving the sigmoid colon within the left lower abdomen/pelvis without definable/drainable fluid collection. Clinical correlation is advised. Further evaluation with colonoscopy after the resolution of acute symptoms is advised to exclude the presence of an underlying lesion. 2. Normal caliber the abdominal aorta without significant atherosclerotic plaque. 3. Suspected hepatic steatosis.  Correlation with LFTs is advised. Electronically Signed   By: Simonne ComeJohn  Watts M.D.   On: 05/24/2020 08:45    Microbiology: Recent Results (from the past 240 hour(s))  Respiratory Panel by RT PCR (Flu Zeniyah Peaster&B, Covid) - Nasopharyngeal Swab     Status: Abnormal   Collection Time: 06/16/20  1:39 AM   Specimen: Nasopharyngeal Swab  Result Value Ref Range Status   SARS Coronavirus 2 by RT PCR POSITIVE (Michaila Kenney) NEGATIVE Final    Comment: HODGES I. 10.12.2021 @ 0415 BY MECIAL J. RESULT CALLED TO, READ BACK BY AND VERIFIED WITH: (NOTE) SARS-CoV-2 target nucleic acids are DETECTED.  SARS-CoV-2 RNA is generally detectable in  upper respiratory specimens  during the acute phase of infection. Positive results are indicative of the presence of the identified virus, but do not rule out bacterial infection or co-infection with other pathogens not detected by the test. Clinical correlation with patient history and other diagnostic information is necessary to determine patient infection status. The expected result is Negative.  Fact Sheet for Patients:  https://www.moore.com/https://www.fda.gov/media/142436/download  Fact Sheet for Healthcare Providers: https://www.young.biz/https://www.fda.gov/media/142435/download  This test is not yet approved or cleared by the [place] FDA and  has been authorized for detection and/or diagnosis of SARS-CoV-2 by FDA under an Emergency Use Authorization (EUA).  This EUA will remain in effect (meaning this test c an be used) for the duration of  the COVID-19 declaration under Section 564(b)(1) of the Act, 21 U.S.C. section 360bbb-3(b)(1), unless the authorization is terminated or revoked sooner.      Influenza Deston Bilyeu by PCR NEGATIVE NEGATIVE Final   Influenza B by PCR NEGATIVE NEGATIVE Final    Comment: (NOTE) The Xpert Xpress SARS-CoV-2/FLU/RSV assay is intended as an aid in  the diagnosis of influenza from Nasopharyngeal swab specimens and  should not be used as Maysen Sudol sole basis for treatment. Nasal washings and  aspirates are unacceptable for Xpert Xpress SARS-CoV-2/FLU/RSV  testing.  Fact Sheet for Patients: https://www.moore.com/https://www.fda.gov/media/142436/download  Fact Sheet for Healthcare Providers: https://www.young.biz/https://www.fda.gov/media/142435/download  This test is not yet approved or cleared by the [place] FDA and  has been authorized for detection and/or diagnosis of SARS-CoV-2 by  FDA under an Emergency Use Authorization (EUA). This EUA will remain  in effect (meaning this test can be used) for the duration of the  Covid-19 declaration under Section 564(b)(1) of the Act, 21  U.S.C. section 360bbb-3(b)(1), unless the  authorization is  terminated or revoked. Performed at [facility], [address] [place], [place] [id number]   Blood culture (routine single)     Status: None (Preliminary result)   Collection Time: 06/16/20  3:51 AM   Specimen: BLOOD  Result Value Ref Range Status   Specimen Description   Final    BLOOD RIGHT ANTECUBITAL Performed at [facility], [address] [place], [place] [address]    Special Requests   Final    BOTTLES DRAWN AEROBIC AND ANAEROBIC Blood Culture adequate volume Performed at [facility], [address] [place], [place] [address]    Culture   Final    NO GROWTH < 24 HOURS Performed at [facility], [address], [place], [place] [address]    Report Status PENDING  Incomplete  Urine culture     Status: None   Collection Time: 06/16/20  6:22 AM   Specimen: In/Out Cath Urine  Result Value Ref Range Status   Specimen Description   Final    IN/OUT CATH URINE Performed at [facility], [address] [place], [place] [address]    Special Requests   Final    NONE Performed at [facility], [address] [place], [place] [address]    Culture   Final    NO GROWTH Performed at [facility], [address], [place], [place] [address]    Report Status 06/17/2020 FINAL  Final     Labs: Basic Metabolic Panel: Recent Labs  Lab 06/16/20 0137 06/17/20 0454  NA 138 141  K 2.9* 3.4*  CL 101 106  CO2 21* 23  GLUCOSE 133* 86  BUN 9 6  CREATININE 0.92 0.93  CALCIUM 9.7 8.8*  MG  --  1.9  PHOS  --  2.9   Liver Function Tests: Recent Labs  Lab 06/16/20 0137 06/17/20 0454  AST 20 16  ALT 41 26  ALKPHOS 87 65  BILITOT 1.7* 0.9  PROT 8.3* 6.4*  ALBUMIN 4.0 2.9*   Recent Labs  Lab 06/16/20 0137  LIPASE 20   No results for input(s): AMMONIA in the last 168 hours. CBC: Recent Labs  Lab 06/16/20 0137 06/17/20 0454  WBC 18.7* 10.4   HGB 14.5 12.6*  HCT 42.9 39.1  MCV 90.5 94.7  PLT 285 218   Cardiac Enzymes: No results for input(s): CKTOTAL, CKMB, CKMBINDEX, TROPONINI in the last 168 hours. BNP: BNP (last 3 results) No results for input(s): BNP in the last 8760 hours.  ProBNP (last 3 results) No results for input(s): PROBNP in the last 8760 hours.  CBG: Recent Labs  Lab 06/16/20 1715 06/16/20 2303 06/17/20 0614 06/17/20 1131 06/17/20 1654  GLUCAP 97 87 94 100* 93       Signed:  Lacretia Nicks MD.  [facility] 06/17/2020, 5:31 PM

## 2020-06-21 LAB — CULTURE, BLOOD (SINGLE)
Culture: NO GROWTH
Special Requests: ADEQUATE

## 2020-10-22 ENCOUNTER — Encounter

## 2021-01-23 IMAGING — CT CT ABD-PELV W/ CM
2 of 4 series · 16 of 46 positions shown, 18 images · IV contrast (omnipaque)
Comparison: None.

CLINICAL DATA: Unspecified abdominal pain, nausea, vomiting

EXAM:
CT ABDOMEN AND PELVIS WITH CONTRAST
TECHNIQUE: Multidetector CT imaging of the abdomen and pelvis was performed
using the standard protocol following bolus administration of
intravenous contrast.
CONTRAST:  100mL OMNIPAQUE IOHEXOL 300 MG/ML  SOLN

[Series 2: axial st · axial · 0.90mm/px · z∈[+1121,+1566]mm · 13 of 103 slices shown, 15 images]
[im 7/103  soft-tissue]
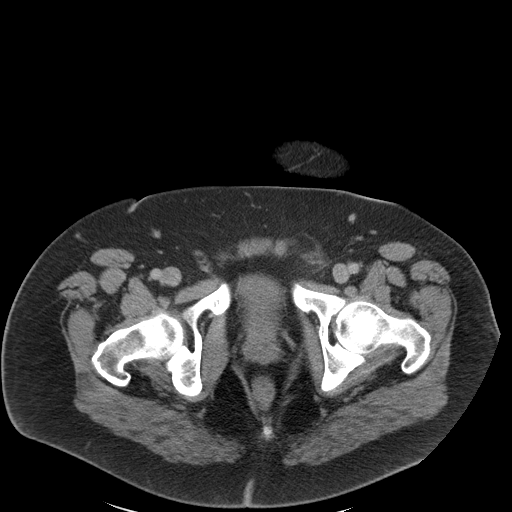
[im 7/103  bone]
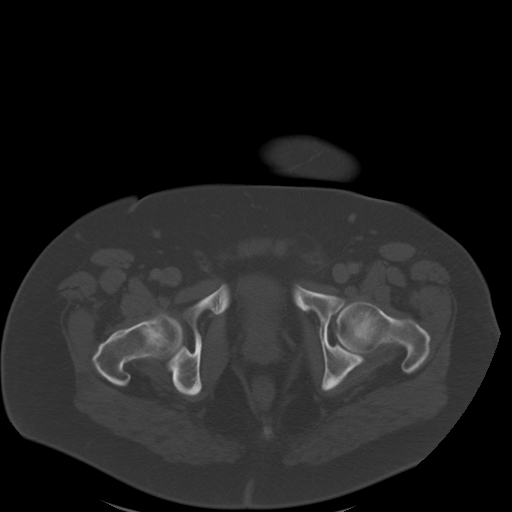
[im 13/103  soft-tissue]
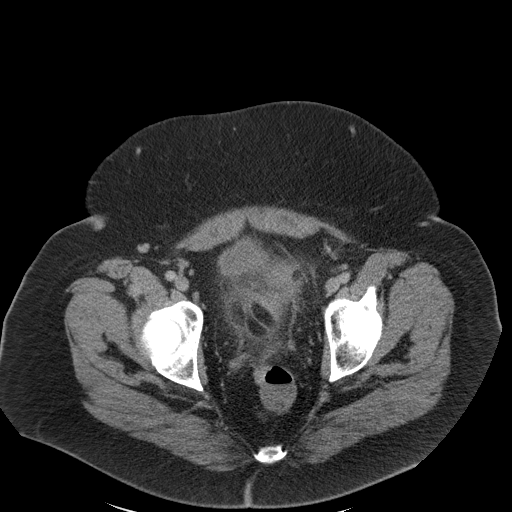
[im 20/103  soft-tissue]
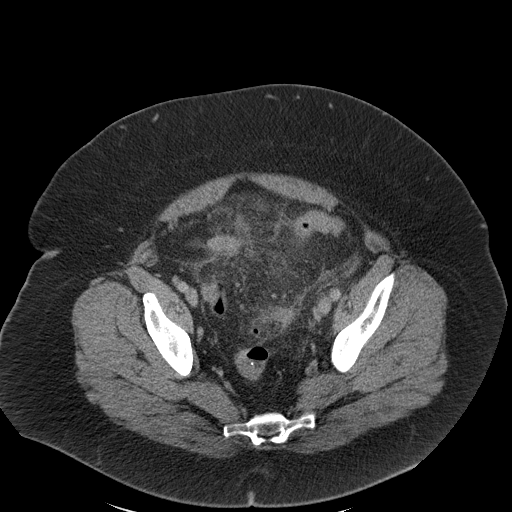
[im 32/103  soft-tissue]
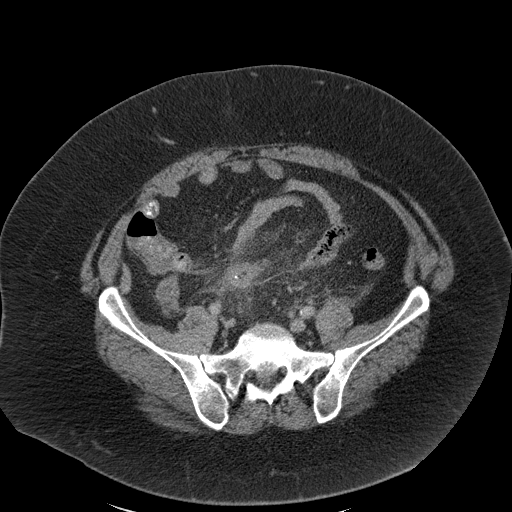
[im 39/103  soft-tissue]
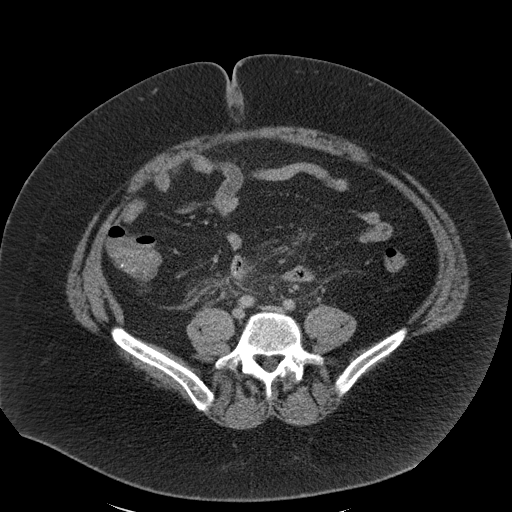
[im 45/103  soft-tissue]
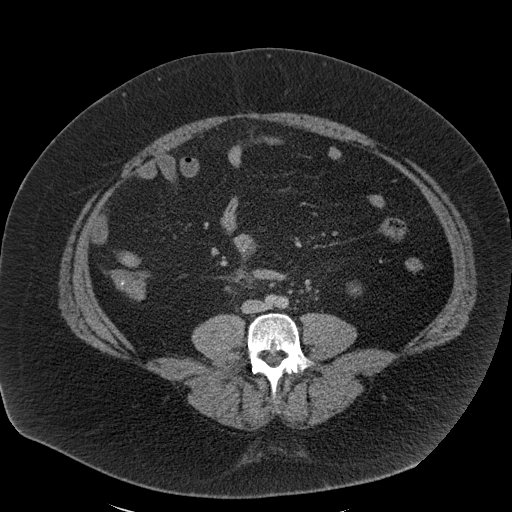
[im 52/103  soft-tissue]
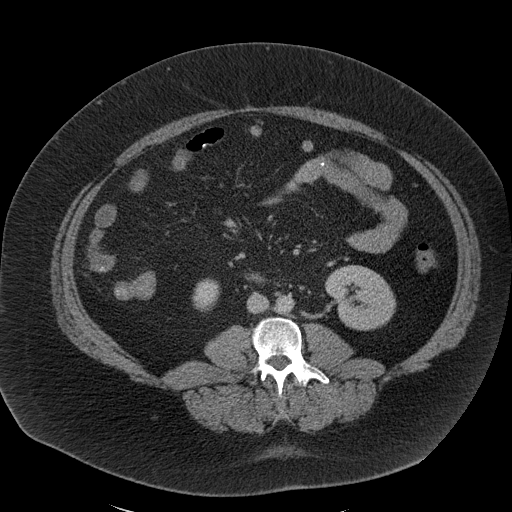
[im 58/103  soft-tissue]
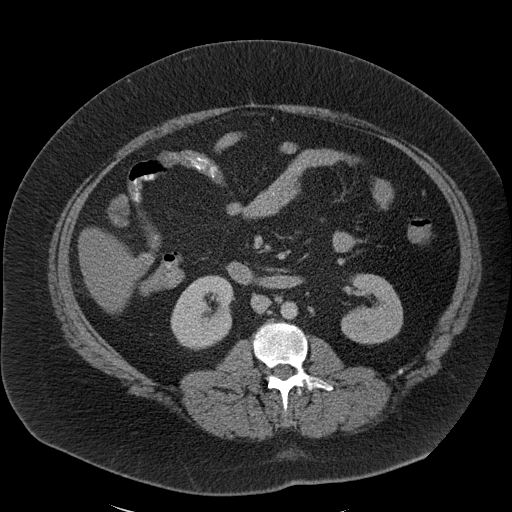
[im 64/103  soft-tissue]
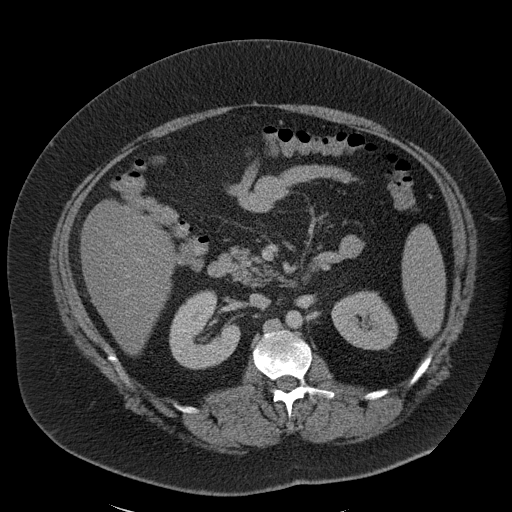
[im 64/103  bone]
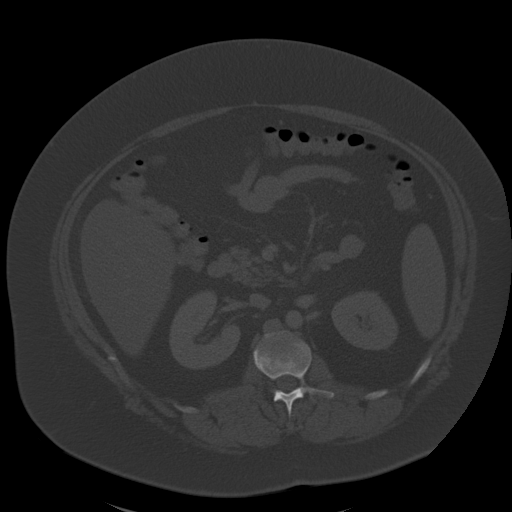
[im 71/103  soft-tissue]
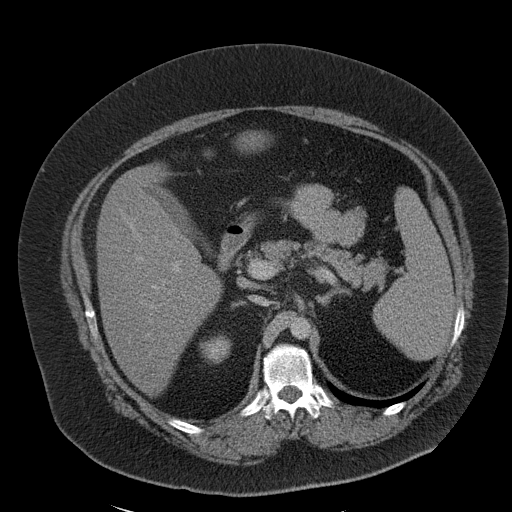
[im 83/103  soft-tissue]
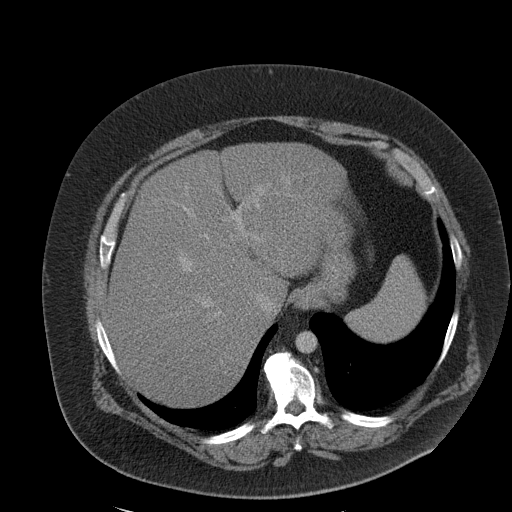
[im 90/103  soft-tissue]
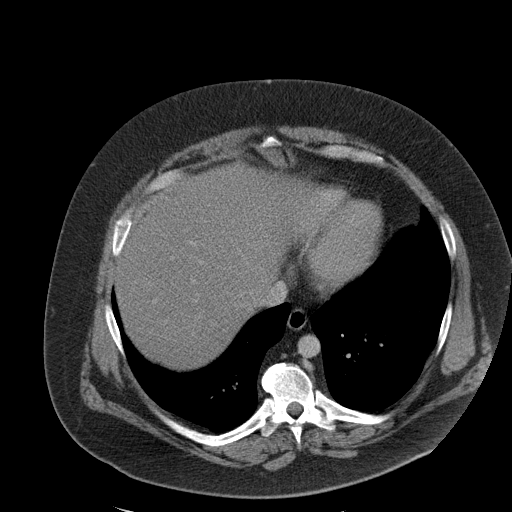
[im 96/103  soft-tissue]
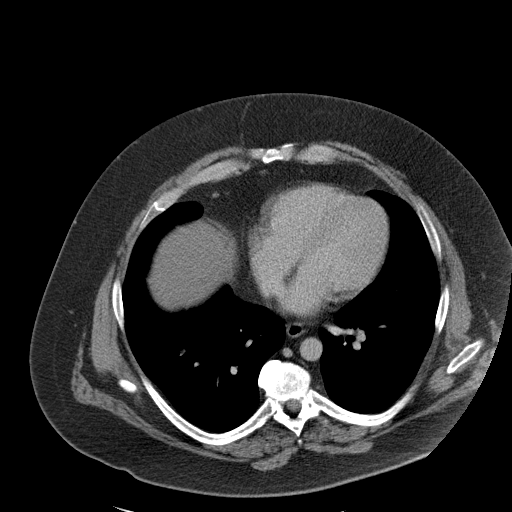

[Series 4: coronal st · coronal · 0.90mm/px · 3 of 182 slices shown]
[im 61/182  soft-tissue]
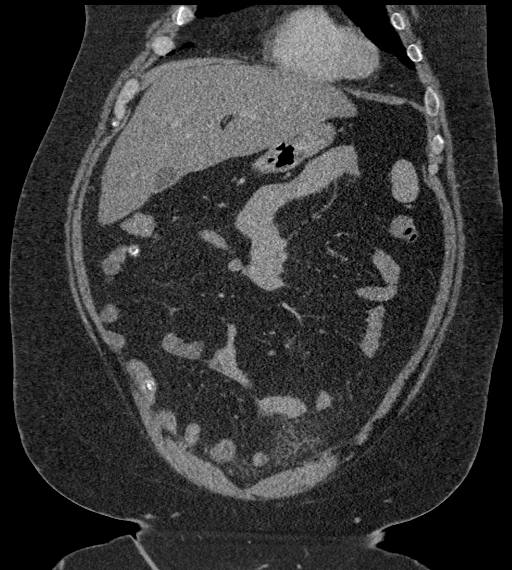
[im 81/182  soft-tissue]
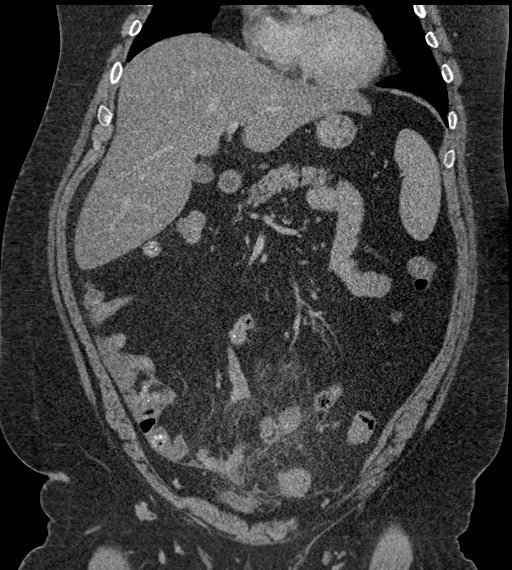
[im 101/182  soft-tissue]
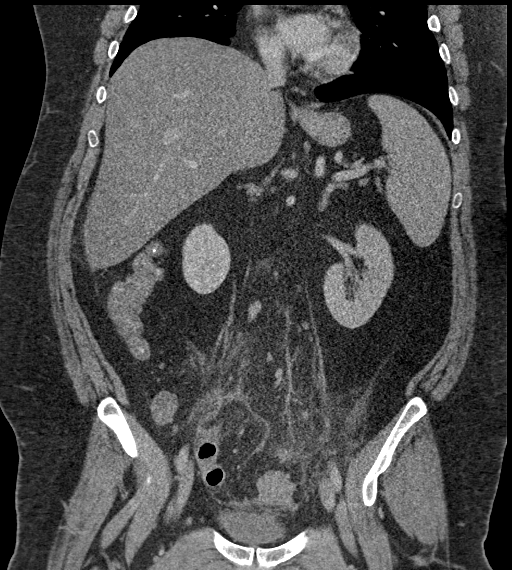

[16 of 46 positions shown; findings below may reference images not displayed]

FINDINGS: Lower chest: The visualized lung bases are clear bilaterally.
Visualized heart and pericardium are unremarkable.

Hepatobiliary: Moderate hepatic steatosis and mild hepatomegaly is
stable. No focal liver lesion. No intra or extrahepatic biliary
ductal dilation. Gallbladder unremarkable.

Pancreas: Unremarkable

Spleen: Mild splenomegaly has progressed slightly since prior
examination, with the spleen now measuring 16.2 cm in greatest
dimension. No intrasplenic lesion. The splenic vein is patent.

Adrenals/Urinary Tract: The adrenal glands are unremarkable. The
kidneys are normal. The bladder is largely decompressed.
Inflammatory changes related to the inflammatory process within the
pelvis described below abut the anterior bladder dome, however, the
bladder is otherwise unremarkable.

Stomach/Bowel: There is again noted circumferential thickening
involving the mid sigmoid colon with extensive surrounding
inflammatory stranding the small amount of ill-defined pericolonic
phlegmonous change within the sigmoid mesentery is in keeping with
changes of moderate sigmoid diverticulitis. This appears progressive
since prior examination. Inflammatory change now abuts the bladder
and encompasses several loops of distal small bowel though a direct
fistula is not clearly identified. There is no free intraperitoneal
gas or fluid. No discrete loculated intra-abdominal fluid
collections. There is no evidence of obstruction. The appendix is
unremarkable.

Vascular/Lymphatic: The abdominal vasculature is unremarkable. No
aortic aneurysm. No pathologic lymphadenopathy within the abdomen
and pelvis.

Reproductive: Prostate is unremarkable.

Other: Rectum unremarkable

Musculoskeletal: No acute bone abnormality.
IMPRESSION: Progressive inflammatory changes in keeping with moderate,
uncomplicated sigmoid diverticulitis. Inflammatory changes now in
compass several loops of distal small bowel and abut the bladder,
however, a discrete fistula is not identified. No evidence of
obstruction or perforation. As noted previously, endoscopic
correlation may be helpful once the patient's acute issues have
resolved to confirm the absence of an underlying mass lesion.

Stable moderate hepatic steatosis and mild hepatomegaly.

Progressive mild splenomegaly.
